# Patient Record
Sex: Male | Born: 1965 | Race: White | Hispanic: No | Marital: Single | Smoking: Current every day smoker
Health system: Southern US, Community
[De-identification: ages and names within clinical notes are randomized; demographics above are authoritative.]

## PROBLEM LIST (undated history)

## (undated) DIAGNOSIS — F32A Depression, unspecified: Secondary | ICD-10-CM

## (undated) DIAGNOSIS — I10 Essential (primary) hypertension: Secondary | ICD-10-CM

## (undated) DIAGNOSIS — F329 Major depressive disorder, single episode, unspecified: Secondary | ICD-10-CM

## (undated) HISTORY — DX: Major depressive disorder, single episode, unspecified: F32.9

## (undated) HISTORY — DX: Depression, unspecified: F32.A

---

## 2004-05-23 ENCOUNTER — Emergency Department (HOSPITAL_COMMUNITY): Admission: EM | Admit: 2004-05-23 | Discharge: 2004-05-23 | Payer: Self-pay | Admitting: Family Medicine

## 2005-09-18 ENCOUNTER — Emergency Department (HOSPITAL_COMMUNITY): Admission: EM | Admit: 2005-09-18 | Discharge: 2005-09-18 | Payer: Self-pay | Admitting: Emergency Medicine

## 2011-08-26 ENCOUNTER — Encounter: Payer: Self-pay | Admitting: Family Medicine

## 2011-08-26 ENCOUNTER — Ambulatory Visit (INDEPENDENT_AMBULATORY_CARE_PROVIDER_SITE_OTHER): Payer: Self-pay | Admitting: Family Medicine

## 2011-08-26 VITALS — BP 140/84 | HR 85 | Ht 71.0 in | Wt 180.9 lb

## 2011-08-26 DIAGNOSIS — F17200 Nicotine dependence, unspecified, uncomplicated: Secondary | ICD-10-CM

## 2011-08-26 DIAGNOSIS — Z720 Tobacco use: Secondary | ICD-10-CM

## 2011-09-09 ENCOUNTER — Encounter: Payer: Self-pay | Admitting: Family Medicine

## 2011-09-09 DIAGNOSIS — Z720 Tobacco use: Secondary | ICD-10-CM | POA: Insufficient documentation

## 2011-09-09 NOTE — Assessment & Plan Note (Signed)
Counseled on tobacco cessation.  I think he needs PFT's once he gets established with [facility].  Will base treatment options depending on PFT's.

## 2011-09-09 NOTE — Progress Notes (Signed)
  Subjective:    Patient ID: Evan Bennett, male    DOB: 17-Mar-1966, 45 y.o.   MRN: [id number]  HPI  Here to establish care so that he could apply for [facility] card.  Denies any current problems.  Has a hx of tobacco use, currenlty smokes 1ppd for the past 25 years.  Does get occasional symptoms of SOB and thinks he may have a history of COPD, does not recall being on any medication.  Review of Systems     Objective:   Physical Exam  Constitutional: He is oriented to person, place, and time. He appears well-developed and well-nourished. No distress.       Smells of cigarette smoke  HENT:  Head: Normocephalic and atraumatic.  Cardiovascular: Normal rate and regular rhythm.  Exam reveals no gallop and no friction rub.   No murmur heard. Pulmonary/Chest: Effort normal and breath sounds normal. No respiratory distress. He has no wheezes.  Abdominal: Soft. Bowel sounds are normal. He exhibits no distension. There is no tenderness.  Musculoskeletal: He exhibits no edema.  Neurological: He is alert and oriented to person, place, and time.  Psychiatric: He has a normal mood and affect.          Assessment & Plan:

## 2018-05-29 ENCOUNTER — Other Ambulatory Visit: Payer: Self-pay | Admitting: Orthopedic Surgery

## 2018-05-29 DIAGNOSIS — M533 Sacrococcygeal disorders, not elsewhere classified: Secondary | ICD-10-CM

## 2018-06-16 ENCOUNTER — Ambulatory Visit
Admission: RE | Admit: 2018-06-16 | Discharge: 2018-06-16 | Disposition: A | Discharge status: Home / Self Care | Payer: Worker's Compensation | Source: Ambulatory Visit | Attending: Orthopedic Surgery | Admitting: Orthopedic Surgery

## 2018-06-16 ENCOUNTER — Ambulatory Visit
Admission: RE | Admit: 2018-06-16 | Discharge: 2018-06-16 | Disposition: A | Discharge status: Home / Self Care | Payer: Self-pay | Source: Ambulatory Visit | Attending: Orthopedic Surgery | Admitting: Orthopedic Surgery

## 2018-06-16 DIAGNOSIS — M533 Sacrococcygeal disorders, not elsewhere classified: Secondary | ICD-10-CM

## 2018-06-30 ENCOUNTER — Other Ambulatory Visit: Payer: Self-pay | Admitting: Orthopedic Surgery

## 2018-06-30 DIAGNOSIS — M533 Sacrococcygeal disorders, not elsewhere classified: Secondary | ICD-10-CM

## 2018-07-14 ENCOUNTER — Ambulatory Visit
Admission: RE | Admit: 2018-07-14 | Discharge: 2018-07-14 | Disposition: A | Discharge status: Home / Self Care | Payer: Worker's Compensation | Source: Ambulatory Visit | Attending: Orthopedic Surgery | Admitting: Orthopedic Surgery

## 2018-07-14 DIAGNOSIS — M533 Sacrococcygeal disorders, not elsewhere classified: Secondary | ICD-10-CM

## 2018-07-14 MED ORDER — METHYLPREDNISOLONE ACETATE 40 MG/ML INJ SUSP (RADIOLOG
120.0000 mg | Freq: Once | INTRAMUSCULAR | Status: AC
  Administered 2018-07-14: 120 mg via INTRA_ARTICULAR

## 2019-10-07 IMAGING — CT CT BIOPSY
2 of 4 series · 14 of 32 positions shown, 19 images · non-contrast
Comparison: none

CLINICAL DATA: LEFT sacroiliac joint dysfunction. Worker's
compensation injury.

[Series 2: needle -guided injection · axial · 0.66mm/px · z∈[-174,-44]mm · 8 of 85 slices shown, 13 images (1 of 2)]
[im 10/85  soft-tissue]
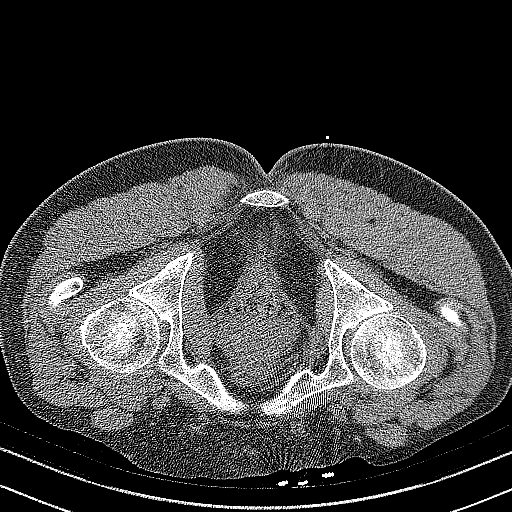
[im 10/85  bone]
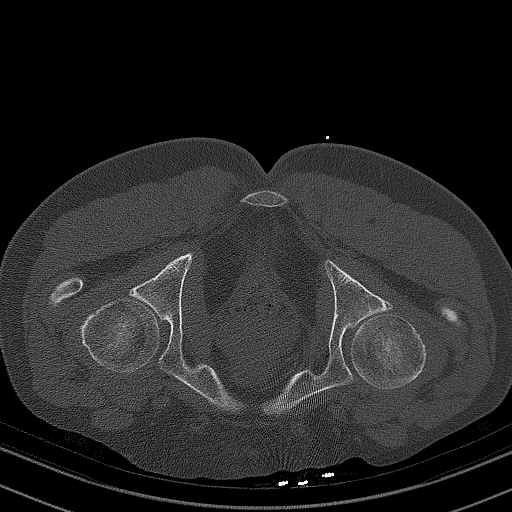
[im 19/85  soft-tissue]
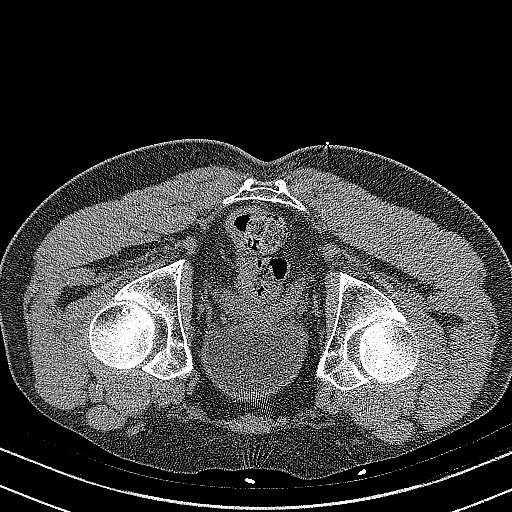
[im 29/85  soft-tissue]
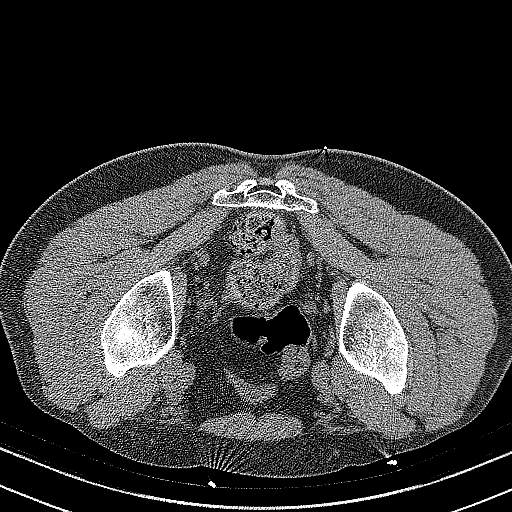
[im 38/85  soft-tissue]
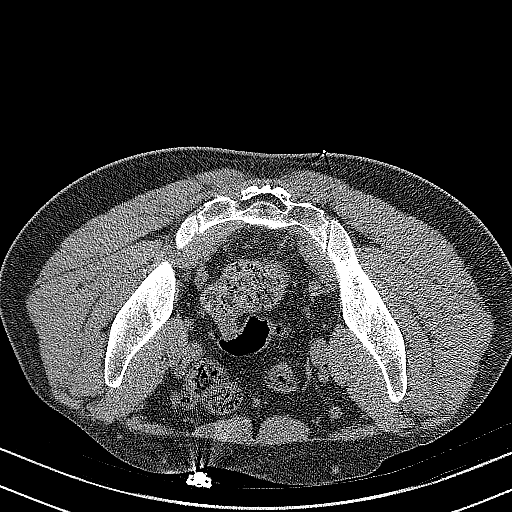
[im 47/85  soft-tissue]
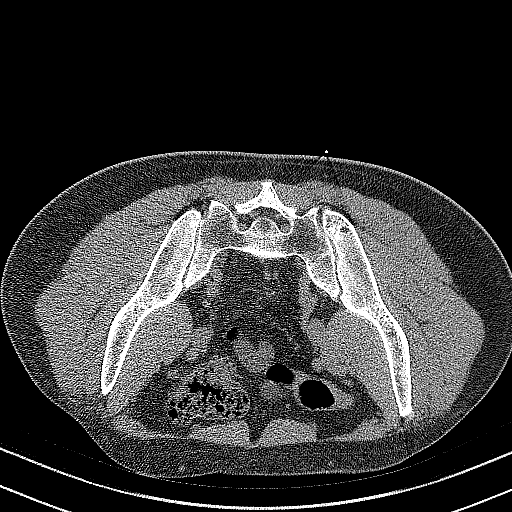
[im 47/85  lung]
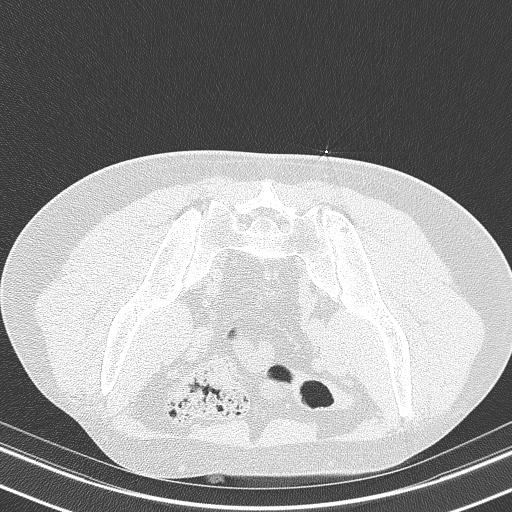
[im 57/85  soft-tissue]
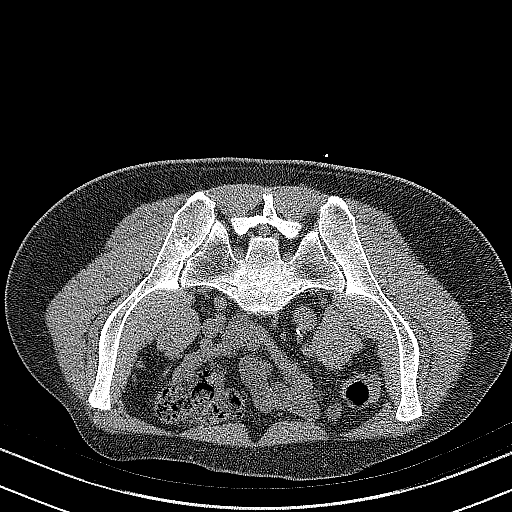
[im 57/85  lung]
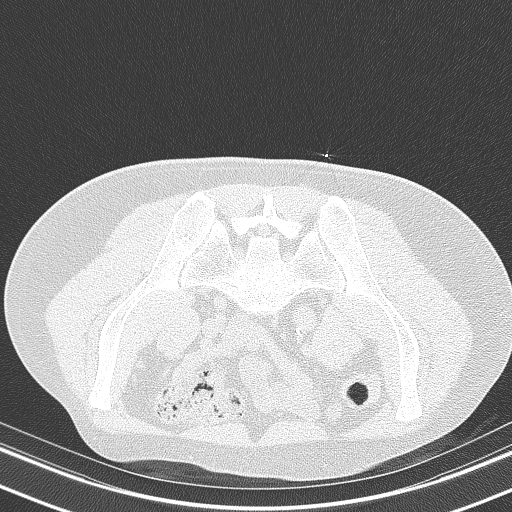
[im 66/85  soft-tissue]
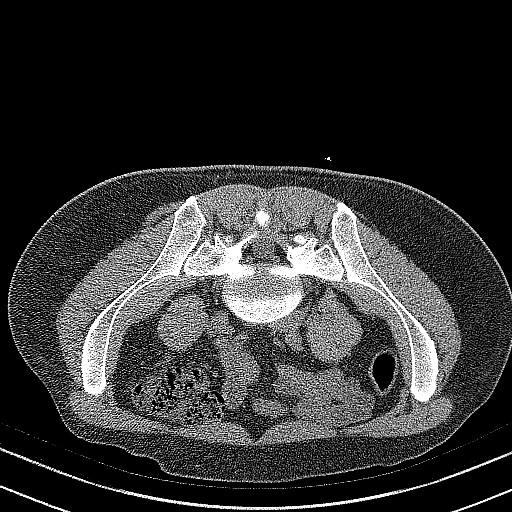
[im 66/85  lung]
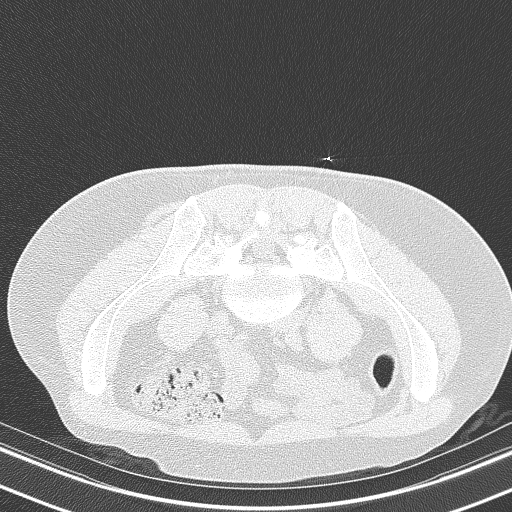
[im 75/85  soft-tissue]
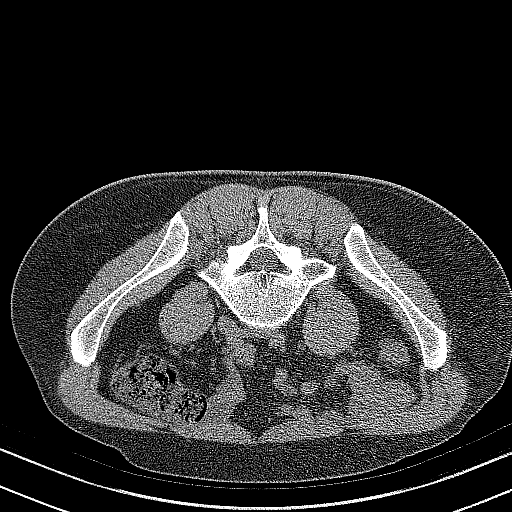
[im 75/85  lung]
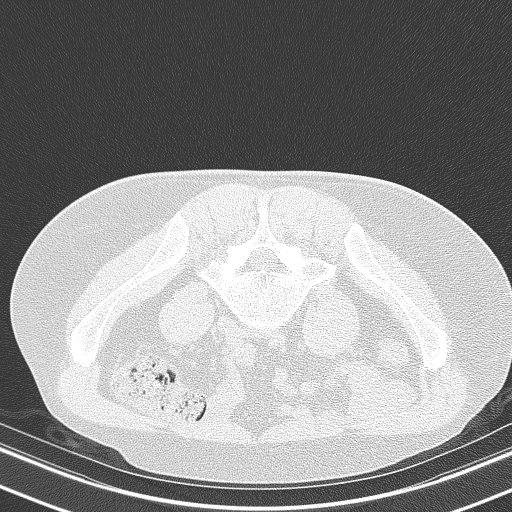

[Series 3: needle -guided injection · axial · 0.66mm/px · z∈[-174,-80]mm · 6 of 85 slices shown (2 of 2)]
[im 10/85  soft-tissue]
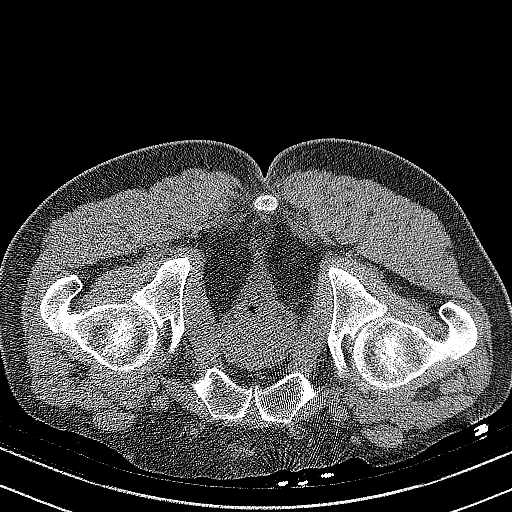
[im 19/85  soft-tissue]
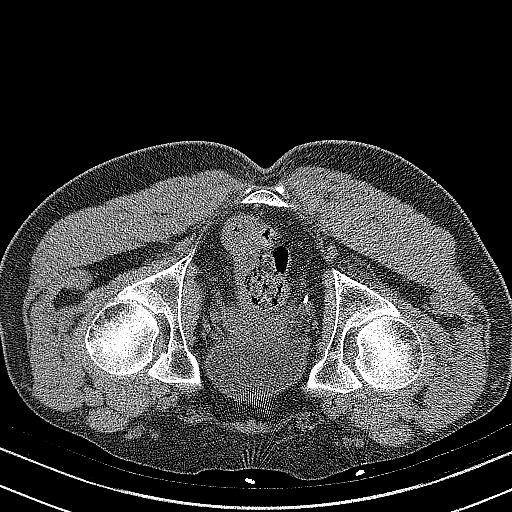
[im 29/85  soft-tissue]
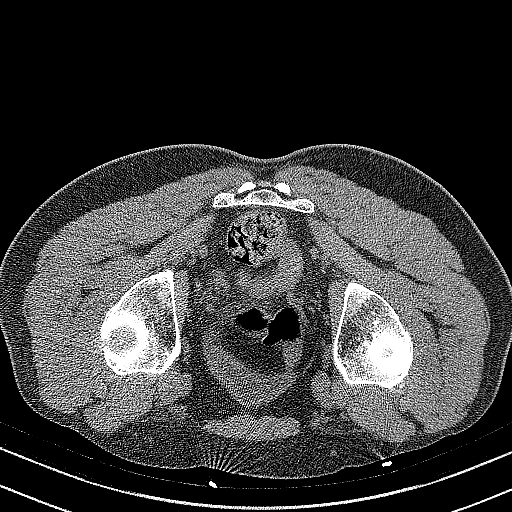
[im 38/85  soft-tissue]
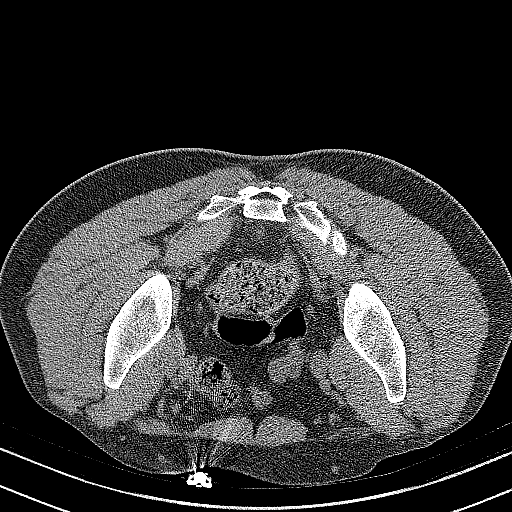
[im 47/85  soft-tissue]
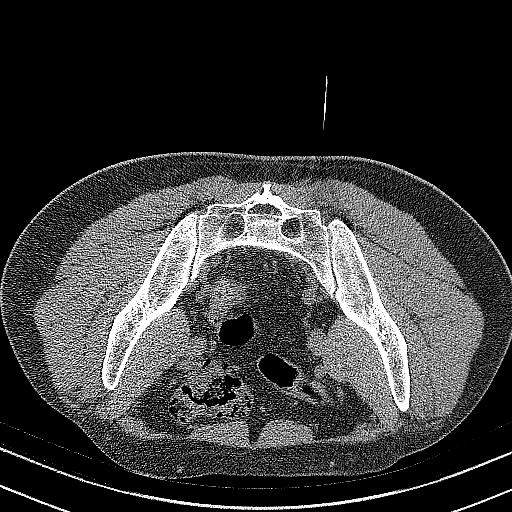
[im 57/85  soft-tissue]
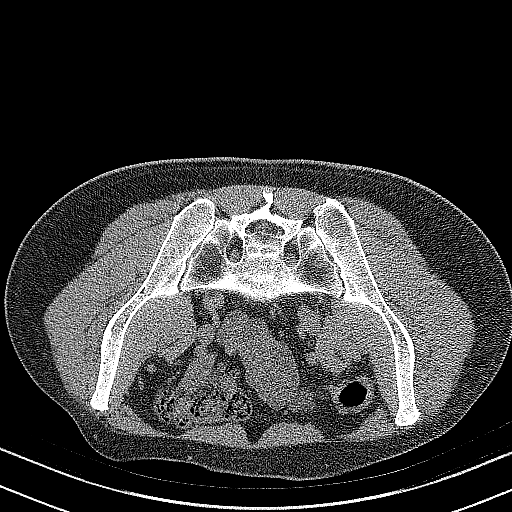

[14 of 32 positions shown; findings below may reference images not displayed]

EXAM:
LEFT CT GUIDED SI JOINT INJECTION



After local anesthesia with 1% lidocaine without epinephrine and
subsequent deep anesthesia, a 22 gauge spinal needle was advanced
into the LEFT SI joint under intermittent CT guidance.

Once the needle was in satisfactory position, representative image
was captured with the needle demonstrated in the sacroiliac joint
following contrast injection using a small amount of Isovue-M 200.
Subsequently, 120 mg Depo-Medrol was injected into the LEFT SI joint
and followed by 1 mL of 0.5% Sensorcaine. Needles removed and a
sterile dressing applied.

No complications were observed. Postprocedure, the patient endorsed
improvement.
IMPRESSION: Successful CT-guided LEFT SI joint injection with steroid.

## 2021-04-30 ENCOUNTER — Emergency Department (HOSPITAL_COMMUNITY)
Admission: EM | Admit: 2021-04-30 | Discharge: 2021-04-30 | Disposition: A | Discharge status: Home / Self Care | Payer: Self-pay | Attending: Emergency Medicine | Admitting: Emergency Medicine

## 2021-04-30 ENCOUNTER — Other Ambulatory Visit: Payer: Self-pay

## 2021-04-30 ENCOUNTER — Encounter (HOSPITAL_COMMUNITY): Payer: Self-pay | Admitting: Emergency Medicine

## 2021-04-30 DIAGNOSIS — F1193 Opioid use, unspecified with withdrawal: Secondary | ICD-10-CM

## 2021-04-30 DIAGNOSIS — F17210 Nicotine dependence, cigarettes, uncomplicated: Secondary | ICD-10-CM | POA: Insufficient documentation

## 2021-04-30 DIAGNOSIS — Z79899 Other long term (current) drug therapy: Secondary | ICD-10-CM | POA: Insufficient documentation

## 2021-04-30 DIAGNOSIS — Y900 Blood alcohol level of less than 20 mg/100 ml: Secondary | ICD-10-CM | POA: Insufficient documentation

## 2021-04-30 DIAGNOSIS — F1123 Opioid dependence with withdrawal: Secondary | ICD-10-CM | POA: Insufficient documentation

## 2021-04-30 LAB — COMPREHENSIVE METABOLIC PANEL
ALT: 18 U/L (ref 0–44)
AST: 19 U/L (ref 15–41)
Albumin: 3.9 g/dL (ref 3.5–5.0)
Alkaline Phosphatase: 89 U/L (ref 38–126)
Anion gap: 8 (ref 5–15)
BUN: 11 mg/dL (ref 6–20)
CO2: 26 mmol/L (ref 22–32)
Calcium: 9.4 mg/dL (ref 8.9–10.3)
Chloride: 107 mmol/L (ref 98–111)
Creatinine, Ser: 0.94 mg/dL (ref 0.61–1.24)
GFR, Estimated: >60 mL/min (ref >60)
Glucose, Bld: 97 mg/dL (ref 70–99)
Potassium: 3.7 mmol/L (ref 3.5–5.1)
Sodium: 141 mmol/L (ref 135–145)
Total Bilirubin: 0.7 mg/dL (ref 0.3–1.2)
Total Protein: 7.1 g/dL (ref 6.5–8.1)

## 2021-04-30 LAB — CBC
HCT: 43.7 % (ref 39.0–52.0)
Hemoglobin: 14.7 g/dL (ref 13.0–17.0)
MCH: 30.1 pg (ref 26.0–34.0)
MCHC: 33.6 g/dL (ref 30.0–36.0)
MCV: 89.5 fL (ref 80.0–100.0)
Platelets: 215 10*3/uL (ref 150–400)
RBC: 4.88 MIL/uL (ref 4.22–5.81)
RDW: 13.1 % (ref 11.5–15.5)
WBC: 11.9 10*3/uL — ABNORMAL HIGH (ref 4.0–10.5)
nRBC: 0 % (ref 0.0–0.2)

## 2021-04-30 LAB — ACETAMINOPHEN LEVEL: Acetaminophen (Tylenol), Serum: <10 ug/mL — ABNORMAL LOW (ref 10–30)

## 2021-04-30 LAB — ETHANOL: Alcohol, Ethyl (B): <10 mg/dL (ref <10)

## 2021-04-30 LAB — SALICYLATE LEVEL: Salicylate Lvl: <7 mg/dL — ABNORMAL LOW (ref 7.0–30.0)

## 2021-04-30 MED ORDER — NAPROXEN 500 MG PO TABS
500.0000 mg | ORAL_TABLET | Freq: Two times a day (BID) | ORAL | Status: DC | PRN
  Administered 2021-04-30: 500 mg via ORAL
  Filled 2021-04-30: qty 1

## 2021-04-30 MED ORDER — CLONIDINE HCL 0.1 MG PO TABS
0.1000 mg | ORAL_TABLET | Freq: Four times a day (QID) | ORAL | Status: DC
  Filled 2021-04-30: qty 1

## 2021-04-30 MED ORDER — HYDROXYZINE HCL 25 MG PO TABS
25.0000 mg | ORAL_TABLET | Freq: Four times a day (QID) | ORAL | Status: DC | PRN
  Administered 2021-04-30: 25 mg via ORAL
  Filled 2021-04-30: qty 1

## 2021-04-30 MED ORDER — ONDANSETRON HCL 4 MG PO TABS: 4.0000 mg | ORAL_TABLET | Freq: Three times a day (TID) | ORAL | 0 refills | Status: DC | PRN

## 2021-04-30 MED ORDER — NAPROXEN 500 MG PO TABS: 500.0000 mg | ORAL_TABLET | Freq: Two times a day (BID) | ORAL | 0 refills | Status: DC

## 2021-04-30 MED ORDER — ONDANSETRON 4 MG PO TBDP
4.0000 mg | ORAL_TABLET | Freq: Four times a day (QID) | ORAL | Status: DC | PRN
  Administered 2021-04-30: 4 mg via ORAL
  Filled 2021-04-30: qty 1

## 2021-04-30 MED ORDER — CLONIDINE HCL 0.1 MG PO TABS: 0.1000 mg | ORAL_TABLET | Freq: Two times a day (BID) | ORAL | Status: DC

## 2021-04-30 MED ORDER — METHOCARBAMOL 500 MG PO TABS
500.0000 mg | ORAL_TABLET | Freq: Three times a day (TID) | ORAL | Status: DC | PRN
  Administered 2021-04-30: 500 mg via ORAL
  Filled 2021-04-30: qty 1

## 2021-04-30 MED ORDER — HYDROXYZINE HCL 25 MG PO TABS: 25.0000 mg | ORAL_TABLET | Freq: Four times a day (QID) | ORAL | 0 refills | Status: DC

## 2021-04-30 MED ORDER — CLONIDINE HCL 0.1 MG PO TABS: ORAL_TABLET | ORAL | 1 refills | Status: DC

## 2021-04-30 MED ORDER — METHOCARBAMOL 500 MG PO TABS: 500.0000 mg | ORAL_TABLET | Freq: Two times a day (BID) | ORAL | 0 refills | Status: DC

## 2021-04-30 MED ORDER — LOPERAMIDE HCL 2 MG PO CAPS
2.0000 mg | ORAL_CAPSULE | ORAL | Status: DC | PRN
  Administered 2021-04-30: 2 mg via ORAL
  Filled 2021-04-30: qty 1

## 2021-04-30 MED ORDER — DICYCLOMINE HCL 20 MG PO TABS
20.0000 mg | ORAL_TABLET | Freq: Four times a day (QID) | ORAL | Status: DC | PRN
  Administered 2021-04-30: 20 mg via ORAL
  Filled 2021-04-30: qty 1

## 2021-04-30 MED ORDER — CLONIDINE HCL 0.1 MG PO TABS
0.1000 mg | ORAL_TABLET | Freq: Every day | ORAL | Status: DC
  Administered 2021-04-30: 0.1 mg via ORAL

## 2021-04-30 MED ORDER — LOPERAMIDE HCL 2 MG PO CAPS: 2.0000 mg | ORAL_CAPSULE | Freq: Four times a day (QID) | ORAL | 0 refills | Status: DC | PRN

## 2021-04-30 MED ORDER — DICYCLOMINE HCL 20 MG PO TABS: 20.0000 mg | ORAL_TABLET | Freq: Two times a day (BID) | ORAL | 0 refills | Status: DC

## 2021-04-30 MED ORDER — LORAZEPAM 2 MG/ML IJ SOLN
2.0000 mg | Freq: Once | INTRAMUSCULAR | Status: AC
  Administered 2021-04-30: 2 mg via INTRAMUSCULAR
  Filled 2021-04-30: qty 1

## 2021-04-30 NOTE — Discharge Instructions (Addendum)
1. Medications: Clonidine withdrawal protocol; usual home medications 2. Treatment: rest, drink plenty of fluids,  3. Follow Up: Please followup with your primary doctor in 2-3 days for discussion of your diagnoses and further evaluation after today's visit; if you do not have a primary care doctor use the resource guide provided to find one; Please return to the ER for new or worsening symptoms

## 2021-04-30 NOTE — ED Notes (Signed)
Walked into room, patient has not provided a urine sample. Patient was standing at the edge of the bed. Stated "it's haunting me".  Stated the medicine is not working. I advised I would let the provider know.

## 2021-04-30 NOTE — ED Notes (Signed)
Clonidine given now at providers request. Patient is hollering and moaning, shaking the rails of the bed.

## 2021-04-30 NOTE — ED Notes (Signed)
Walked into room, patient was being seen by the provider. Prior to that, patient had refused to give urine sample. However after talking to patient he stated he would agree to the urine sample and stated he needed help for withdrawal. Patient stated he last inhaled fentanyl at 9am.

## 2021-04-30 NOTE — ED Triage Notes (Signed)
Patient recently started taking an opioid that was not prescribed. Patient comes from home. Today is the first day he has not taken any opioids today.

## 2021-04-30 NOTE — ED Notes (Addendum)
Walked into room, patient was standing at the cabinets. Patient had opened the cabinet doors and was hitting his head against the cabinet shelves. I advised the patient to get back in to the bed for his safety and assisted him. Patient began screaming that the medications were not working. I reminded the patient that he needed to give the medications time. PA is aware.

## 2021-04-30 NOTE — ED Notes (Signed)
Pt ambulated out of ED. Verbalized understanding of his d/c papers

## 2021-04-30 NOTE — ED Notes (Signed)
Patient was found walking around the ER. Patient did not want to be redirected into his room. I advised the patient that he could not walk into other patients room. Patient attempted to jump on the table next to [facility] and started the patient in the hallway. Patient was finally redirected to his room.

## 2021-04-30 NOTE — ED Provider Notes (Signed)
[facility]-EMERGENCY DEPT Provider Note   CSN: [id number] Arrival date & time: 04/30/21  0129     History Chief Complaint  Patient presents with   Anxiety    Evan Bennett is a 55 y.o. male presents to the emergency department complaining of opiate withdrawal.  Reports he has been snorting fentanyl for the last several years.  Denies IV drug use.  Reports that he is in withdrawal tonight because he has run out of money to buy fentanyl.  Denies suicidal ideation, homicidal ideation, auditory visual hallucinations.  Reports he simply feels anxious and wants help.  He states that opiates have ruined his life and he wants to clean but he does not know how.  Denies headache, vision changes, chest pain, shortness of breath, abdominal pain, nausea, vomiting, diarrhea, weakness, dizziness, syncope, palpitations.  No treatments prior to arrival.  No specific aggravating or alleviating factors aside from lack of opiates.  The history is provided by the patient and medical records. No language interpreter was used.      Past Medical History:  Diagnosis Date   Depression     Patient Active Problem List   Diagnosis Date Noted   Tobacco abuse 09/09/2011    History reviewed. No pertinent surgical history.     Family History  Problem Relation Age of Onset   Alzheimer's disease Mother    Lung cancer Father    Hypertension Mother    Hypertension Brother     Social History   Tobacco Use   Smoking status: Every Day    Packs/day: 1.00    Types: Cigarettes  Vaping Use   Vaping Use: Never used  Substance Use Topics   Alcohol use: Not Currently   Drug use: Yes    Home Medications Prior to Admission medications   Medication Sig Start Date End Date Taking? Authorizing Provider  cloNIDine (CATAPRES) 0.1 MG tablet 0.1mg  Oral 4x daily x8 doses; 0.1mg  oral BH q-am and hs x 4 doses; 0.1mg  oral daily before breakfast x 2 doses 04/30/21  Yes Jerita Wimbush, Dahlia Client, PA-C   dicyclomine (BENTYL) 20 MG tablet Take 1 tablet (20 mg total) by mouth 2 (two) times daily. 04/30/21  Yes Cindel Daugherty, Dahlia Client, PA-C  hydrOXYzine (ATARAX/VISTARIL) 25 MG tablet Take 1 tablet (25 mg total) by mouth every 6 (six) hours. 04/30/21  Yes Alarik Radu, Dahlia Client, PA-C  loperamide (IMODIUM) 2 MG capsule Take 1 capsule (2 mg total) by mouth 4 (four) times daily as needed for diarrhea or loose stools. 04/30/21  Yes Luian Schumpert, Dahlia Client, PA-C  methocarbamol (ROBAXIN) 500 MG tablet Take 1 tablet (500 mg total) by mouth 2 (two) times daily. 04/30/21  Yes Abram Sax, Dahlia Client, PA-C  naproxen (NAPROSYN) 500 MG tablet Take 1 tablet (500 mg total) by mouth 2 (two) times daily with a meal. 04/30/21  Yes Dash Cardarelli, Dahlia Client, PA-C  ondansetron (ZOFRAN) 4 MG tablet Take 1 tablet (4 mg total) by mouth every 8 (eight) hours as needed for nausea or vomiting. 04/30/21  Yes Pankaj Haack, Dahlia Client, PA-C    Allergies    Patient has no known allergies.  Review of Systems   Review of Systems  Constitutional:  Negative for appetite change, diaphoresis, fatigue, fever and unexpected weight change.  HENT:  Negative for mouth sores.   Eyes:  Negative for visual disturbance.  Respiratory:  Negative for cough, chest tightness, shortness of breath and wheezing.   Cardiovascular:  Negative for chest pain.  Gastrointestinal:  Negative for abdominal pain, constipation, diarrhea, nausea and  vomiting.  Endocrine: Negative for polydipsia, polyphagia and polyuria.  Genitourinary:  Negative for dysuria, frequency, hematuria and urgency.  Musculoskeletal:  Negative for back pain and neck stiffness.  Skin:  Negative for rash.  Allergic/Immunologic: Negative for immunocompromised state.  Neurological:  Negative for syncope, light-headedness and headaches.  Hematological:  Does not bruise/bleed easily.  Psychiatric/Behavioral:  Positive for agitation. Negative for sleep disturbance. The patient is nervous/anxious and is  hyperactive.    Physical Exam Updated Vital Signs BP (!) 145/110   Pulse 86   Temp 98.4 F (36.9 C)   Resp 16   Ht 5\' 8"  (1.727 m)   Wt 72.6 kg   SpO2 99%   BMI 24.33 kg/m   Physical Exam Vitals and nursing note reviewed.  Constitutional:      General: He is not in acute distress.    Appearance: He is not diaphoretic.  HENT:     Head: Normocephalic.  Eyes:     General: No scleral icterus.    Conjunctiva/sclera: Conjunctivae normal.  Cardiovascular:     Rate and Rhythm: Normal rate and regular rhythm.     Pulses: Normal pulses.          Radial pulses are 2+ on the right side and 2+ on the left side.  Pulmonary:     Effort: No tachypnea, accessory muscle usage, prolonged expiration, respiratory distress or retractions.     Breath sounds: No stridor.     Comments: Equal chest rise. No increased work of breathing. Abdominal:     General: There is no distension.     Palpations: Abdomen is soft.     Tenderness: There is no abdominal tenderness. There is no guarding or rebound.  Musculoskeletal:     Cervical back: Normal range of motion.     Comments: Moves all extremities equally and without difficulty.  Skin:    General: Skin is warm and dry.     Capillary Refill: Capillary refill takes less than 2 seconds.  Neurological:     Mental Status: He is alert.     GCS: GCS eye subscore is 4. GCS verbal subscore is 5. GCS motor subscore is 6.     Comments: Speech is clear and goal oriented.  Psychiatric:        Attention and Perception: He does not perceive auditory or visual hallucinations.        Mood and Affect: Mood normal.        Behavior: Behavior is agitated and aggressive.        Thought Content: Thought content does not include homicidal or suicidal ideation. Thought content does not include homicidal or suicidal plan.        Judgment: Judgment is impulsive.    ED Results / Procedures / Treatments   Labs (all labs ordered are listed, but only abnormal results are  displayed) Labs Reviewed  SALICYLATE LEVEL - Abnormal; Notable for the following components:      Result Value   Salicylate Lvl <7.0 (*)    All other components within normal limits  ACETAMINOPHEN LEVEL - Abnormal; Notable for the following components:   Acetaminophen (Tylenol), Serum <10 (*)    All other components within normal limits  CBC - Abnormal; Notable for the following components:   WBC 11.9 (*)    All other components within normal limits  COMPREHENSIVE METABOLIC PANEL  ETHANOL  RAPID URINE DRUG SCREEN, HOSP PERFORMED  URINALYSIS, ROUTINE W REFLEX MICROSCOPIC     Procedures Procedures  Medications Ordered in ED Medications  cloNIDine (CATAPRES) tablet 0.1 mg (has no administration in time range)    Followed by  cloNIDine (CATAPRES) tablet 0.1 mg (has no administration in time range)    Followed by  cloNIDine (CATAPRES) tablet 0.1 mg (0.1 mg Oral Given 04/30/21 0245)  dicyclomine (BENTYL) tablet 20 mg (20 mg Oral Given 04/30/21 0245)  hydrOXYzine (ATARAX/VISTARIL) tablet 25 mg (25 mg Oral Given 04/30/21 0245)  loperamide (IMODIUM) capsule 2-4 mg (2 mg Oral Given 04/30/21 0249)  methocarbamol (ROBAXIN) tablet 500 mg (500 mg Oral Given 04/30/21 0245)  naproxen (NAPROSYN) tablet 500 mg (500 mg Oral Given 04/30/21 0246)  ondansetron (ZOFRAN-ODT) disintegrating tablet 4 mg (4 mg Oral Given 04/30/21 0247)  LORazepam (ATIVAN) injection 2 mg (2 mg Intramuscular Given 04/30/21 0201)    ED Course  I have reviewed the triage vital signs and the nursing notes.  Pertinent labs & imaging results that were available during my care of the patient were reviewed by me and considered in my medical decision making (see chart for details).    MDM Rules/Calculators/A&P                           Patient agitated and uncooperative.  Difficult to redirect.  Remains alert and oriented.  Adamantly denies suicidal or homicidal ideation.  Vital signs are reassuring.  Clonidine protocol  initiated in the emergency department and patient symptoms seem to have improved.  Will be discharged home with symptomatic medications and resources for substance abuse.  Peer support referral placed.  Patient ambulatory with steady gait.  BP (!) 143/82 (BP Location: Left Arm)   Pulse 68   Temp 98.4 F (36.9 C)   Resp 16   Ht 5\' 8"  (1.727 m)   Wt 72.6 kg   SpO2 98%   BMI 24.33 kg/m    Final Clinical Impression(s) / ED Diagnoses Final diagnoses:  Opioid withdrawal (HCC)    Rx / DC Orders ED Discharge Orders          Ordered    cloNIDine (CATAPRES) 0.1 MG tablet        04/30/21 0351    dicyclomine (BENTYL) 20 MG tablet  2 times daily        04/30/21 0351    hydrOXYzine (ATARAX/VISTARIL) 25 MG tablet  Every 6 hours        04/30/21 0351    loperamide (IMODIUM) 2 MG capsule  4 times daily PRN        04/30/21 0351    methocarbamol (ROBAXIN) 500 MG tablet  2 times daily        04/30/21 0351    naproxen (NAPROSYN) 500 MG tablet  2 times daily with meals        04/30/21 0351    ondansetron (ZOFRAN) 4 MG tablet  Every 8 hours PRN        04/30/21 0351             Janah Mcculloh, 05/02/21, PA-C 04/30/21 0356    05/02/21, MD 04/30/21 [phone]

## 2023-01-17 ENCOUNTER — Ambulatory Visit (INDEPENDENT_AMBULATORY_CARE_PROVIDER_SITE_OTHER): Payer: No Typology Code available for payment source

## 2023-01-17 ENCOUNTER — Ambulatory Visit (HOSPITAL_COMMUNITY)
Admission: EM | Admit: 2023-01-17 | Discharge: 2023-01-17 | Disposition: A | Discharge status: Home / Self Care | Payer: No Typology Code available for payment source | Attending: Family Medicine | Admitting: Family Medicine

## 2023-01-17 ENCOUNTER — Encounter (HOSPITAL_COMMUNITY): Payer: Self-pay

## 2023-01-17 DIAGNOSIS — J441 Chronic obstructive pulmonary disease with (acute) exacerbation: Secondary | ICD-10-CM | POA: Insufficient documentation

## 2023-01-17 DIAGNOSIS — R911 Solitary pulmonary nodule: Secondary | ICD-10-CM | POA: Insufficient documentation

## 2023-01-17 DIAGNOSIS — J069 Acute upper respiratory infection, unspecified: Secondary | ICD-10-CM

## 2023-01-17 DIAGNOSIS — Z1152 Encounter for screening for COVID-19: Secondary | ICD-10-CM | POA: Insufficient documentation

## 2023-01-17 DIAGNOSIS — B9789 Other viral agents as the cause of diseases classified elsewhere: Secondary | ICD-10-CM | POA: Insufficient documentation

## 2023-01-17 HISTORY — DX: Essential (primary) hypertension: I10

## 2023-01-17 LAB — COMPREHENSIVE METABOLIC PANEL
ALT: 13 U/L (ref 0–44)
AST: 18 U/L (ref 15–41)
Albumin: 3.2 g/dL — ABNORMAL LOW (ref 3.5–5.0)
Alkaline Phosphatase: 104 U/L (ref 38–126)
Anion gap: 11 (ref 5–15)
BUN: 10 mg/dL (ref 6–20)
CO2: 27 mmol/L (ref 22–32)
Calcium: 8.6 mg/dL — ABNORMAL LOW (ref 8.9–10.3)
Chloride: 100 mmol/L (ref 98–111)
Creatinine, Ser: 0.91 mg/dL (ref 0.61–1.24)
GFR, Estimated: >60 mL/min (ref >60)
Glucose, Bld: 89 mg/dL (ref 70–99)
Potassium: 3.8 mmol/L (ref 3.5–5.1)
Sodium: 138 mmol/L (ref 135–145)
Total Bilirubin: 0.5 mg/dL (ref 0.3–1.2)
Total Protein: 6.2 g/dL — ABNORMAL LOW (ref 6.5–8.1)

## 2023-01-17 LAB — CBC
HCT: 41.6 % (ref 39.0–52.0)
Hemoglobin: 13.5 g/dL (ref 13.0–17.0)
MCH: 28.9 pg (ref 26.0–34.0)
MCHC: 32.5 g/dL (ref 30.0–36.0)
MCV: 89.1 fL (ref 80.0–100.0)
Platelets: 217 10*3/uL (ref 150–400)
RBC: 4.67 MIL/uL (ref 4.22–5.81)
RDW: 13 % (ref 11.5–15.5)
WBC: 8.4 10*3/uL (ref 4.0–10.5)
nRBC: 0 % (ref 0.0–0.2)

## 2023-01-17 MED ORDER — BENZONATATE 100 MG PO CAPS: 100.0000 mg | ORAL_CAPSULE | Freq: Three times a day (TID) | ORAL | 0 refills | Status: DC | PRN

## 2023-01-17 MED ORDER — ALBUTEROL SULFATE HFA 108 (90 BASE) MCG/ACT IN AERS: 2.0000 | INHALATION_SPRAY | RESPIRATORY_TRACT | 0 refills | Status: DC | PRN

## 2023-01-17 MED ORDER — PREDNISONE 20 MG PO TABS: 40.0000 mg | ORAL_TABLET | Freq: Every day | ORAL | 0 refills | Status: AC

## 2023-01-17 NOTE — ED Triage Notes (Signed)
Patient c/o a productive cough with yellow/white sputum,and nasal congestion x 2 days.  Patient reports that he has been taking OTC cough drops.   Patient added that he has had bilateral leg swelling x 4-5 months.

## 2023-01-17 NOTE — Discharge Instructions (Signed)
Your chest x-ray did not show any fluid or pneumonia, but there was a tiny nodule that we will need to follow-up CAT scan at a later date.  You can use the QR code/website at the back of the summary paperwork to schedule yourself a new patient appointment with primary care   You have been swabbed for COVID, and the test will result in the next 24 hours. Our staff will call you if positive. If the COVID test is positive, you should quarantine until you are fever free for 24 hours and you are starting to feel better, and then take added precautions for the next 5 days, such as physical distancing/wearing a mask and good hand hygiene/washing.  Albuterol inhaler--do 2 puffs every 4 hours as needed for shortness of breath or wheezing  Take prednisone 20 mg--2 daily for 5 days  Take benzonatate 100 mg, 1 tab every 8 hours as needed for cough.

## 2023-01-17 NOTE — ED Notes (Signed)
New pt appointment was made prior to leaving and pt verbalized understanding of it.

## 2023-01-17 NOTE — ED Provider Notes (Addendum)
[facility]    CSN: [id number] Arrival date & time: 01/17/23  1529      History   Chief Complaint Chief Complaint  Patient presents with   Cough   Nasal Congestion   Leg Swelling    HPI Evan Bennett is a 57 y.o. male.    Cough   here for 2 or 3 days of congestion and cough.  He is producing white and yellow mucus.  No fever noted outside the clinic, but he did have a 99.4 temperature here.  No nausea or vomiting or diarrhea.  He does not mention any shortness of breath.  He has never been noted to have asthma or bronchitis.  He also mentions he has had bilateral leg swelling for about 4 or 5 months.      Past Medical History:  Diagnosis Date   Depression    Hypertension     Patient Active Problem List   Diagnosis Date Noted   Tobacco abuse 09/09/2011    History reviewed. No pertinent surgical history.     Home Medications    Prior to Admission medications   Medication Sig Start Date End Date Taking? Authorizing Provider  albuterol (VENTOLIN HFA) 108 (90 Base) MCG/ACT inhaler Inhale 2 puffs into the lungs every 4 (four) hours as needed for wheezing or shortness of breath. 01/17/23  Yes Zenia Resides, MD  benzonatate (TESSALON) 100 MG capsule Take 1 capsule (100 mg total) by mouth 3 (three) times daily as needed for cough. 01/17/23  Yes Zenia Resides, MD  predniSONE (DELTASONE) 20 MG tablet Take 2 tablets (40 mg total) by mouth daily with breakfast for 5 days. 01/17/23 01/22/23 Yes Leonilda Cozby, Janace Aris, MD    Family History Family History  Problem Relation Age of Onset   Alzheimer's disease Mother    Hypertension Mother    Lung cancer Father    Hypertension Brother     Social History Social History   Tobacco Use   Smoking status: Every Day    Packs/day: 1    Types: Cigarettes  Vaping Use   Vaping Use: Never used  Substance Use Topics   Alcohol use: Not Currently   Drug use: Never     Allergies   Patient has no known  allergies.   Review of Systems Review of Systems  Respiratory:  Positive for cough.      Physical Exam Triage Vital Signs ED Triage Vitals  Enc Vitals Group     BP 01/17/23 1606 130/85     Pulse Rate 01/17/23 1606 81     Resp 01/17/23 1606 16     Temp 01/17/23 1606 99.4 F (37.4 C)     Temp Source 01/17/23 1606 Oral     SpO2 01/17/23 1606 94 %     Weight --      Height --      Head Circumference --      Peak Flow --      Pain Score 01/17/23 1608 0     Pain Loc --      Pain Edu? --      Excl. in GC? --    No data found.  Updated Vital Signs BP 130/85 (BP Location: Left Arm)   Pulse 81   Temp 99.4 F (37.4 C) (Oral)   Resp 16   SpO2 94%   Visual Acuity Right Eye Distance:   Left Eye Distance:   Bilateral Distance:    Right Eye Near:  Left Eye Near:    Bilateral Near:     Physical Exam Vitals reviewed.  Constitutional:      General: He is not in acute distress.    Appearance: He is not ill-appearing, toxic-appearing or diaphoretic.  HENT:     Right Ear: Tympanic membrane and ear canal normal.     Left Ear: Tympanic membrane and ear canal normal.     Nose: Congestion present.     Mouth/Throat:     Mouth: Mucous membranes are moist.     Pharynx: No oropharyngeal exudate or posterior oropharyngeal erythema.  Eyes:     Extraocular Movements: Extraocular movements intact.     Conjunctiva/sclera: Conjunctivae normal.     Pupils: Pupils are equal, round, and reactive to light.  Cardiovascular:     Rate and Rhythm: Normal rate and regular rhythm.     Heart sounds: No murmur heard. Pulmonary:     Effort: No respiratory distress.     Breath sounds: No stridor. No rhonchi or rales.     Comments: There are bilateral wheezes heard, especially after he coughs.  Air movement is good.  There are no rales Musculoskeletal:     Cervical back: Neck supple.     Comments: There is 2+ pitting edema to the upper third of the shins bilaterally.  There is no erythema and  no ulceration  Lymphadenopathy:     Cervical: No cervical adenopathy.  Skin:    Capillary Refill: Capillary refill takes less than 2 seconds.     Coloration: Skin is not jaundiced or pale.  Neurological:     General: No focal deficit present.     Mental Status: He is alert and oriented to person, place, and time.  Psychiatric:        Behavior: Behavior normal.      UC Treatments / Results  Labs (all labs ordered are listed, but only abnormal results are displayed) Labs Reviewed  SARS CORONAVIRUS 2 (TAT 6-24 HRS)  CBC  COMPREHENSIVE METABOLIC PANEL    EKG   Radiology DG Chest 2 View  Result Date: 01/17/2023 CLINICAL DATA:  Cough and shortness of breath EXAM: CHEST - 2 VIEW COMPARISON:  Chest x-ray 09/18/2005 FINDINGS: The heart size and mediastinal contours are within normal limits. On the lateral view nodular density projects over the anterior upper lobes measuring 6 mm, indeterminate. The lungs are otherwise clear. The visualized skeletal structures are unremarkable. IMPRESSION: 1. No active cardiopulmonary disease. 2. 6 mm nodular density projecting over the anterior upper lobes on the lateral view, indeterminate. Follow-up nonemergent chest CT recommended. Electronically Signed   By: Darliss Cheney M.D.   On: 01/17/2023 17:08    Procedures Procedures (including critical care time)  Medications Ordered in UC Medications - No data to display  Initial Impression / Assessment and Plan / UC Course  I have reviewed the triage vital signs and the nursing notes.  Pertinent labs & imaging results that were available during my care of the patient were reviewed by me and considered in my medical decision making (see chart for details).        Chest x-ray is done as much for the history of leg swelling and shortness of breath.  Does not show any fluid nor pneumonia there is a small nodule that we will need follow-up CT.  Albuterol and prednisone are sent in for possible COPD  exacerbation, and Tessalon Perles are sent in for cough.  COVID swab is done  I will  ask staff to help him make a primary care appointment  CBC and CMP are drawn today and we will let him know of any significant abnormalities; this is to address his pedal edema. Final Clinical Impressions(s) / UC Diagnoses   Final diagnoses:  Viral URI with cough  COPD exacerbation (HCC)  Pulmonary nodule     Discharge Instructions      Your chest x-ray did not show any fluid or pneumonia, but there was a tiny nodule that we will need to follow-up CAT scan at a later date.  You can use the QR code/website at the back of the summary paperwork to schedule yourself a new patient appointment with primary care   You have been swabbed for COVID, and the test will result in the next 24 hours. Our staff will call you if positive. If the COVID test is positive, you should quarantine until you are fever free for 24 hours and you are starting to feel better, and then take added precautions for the next 5 days, such as physical distancing/wearing a mask and good hand hygiene/washing.  Albuterol inhaler--do 2 puffs every 4 hours as needed for shortness of breath or wheezing  Take prednisone 20 mg--2 daily for 5 days  Take benzonatate 100 mg, 1 tab every 8 hours as needed for cough.       ED Prescriptions     Medication Sig Dispense Auth. Provider   albuterol (VENTOLIN HFA) 108 (90 Base) MCG/ACT inhaler Inhale 2 puffs into the lungs every 4 (four) hours as needed for wheezing or shortness of breath. 1 each Zenia Resides, MD   predniSONE (DELTASONE) 20 MG tablet Take 2 tablets (40 mg total) by mouth daily with breakfast for 5 days. 10 tablet Zenia Resides, MD   benzonatate (TESSALON) 100 MG capsule Take 1 capsule (100 mg total) by mouth 3 (three) times daily as needed for cough. 21 capsule Zenia Resides, MD      PDMP not reviewed this encounter.   Zenia Resides, MD 01/17/23  1743    Zenia Resides, MD 01/17/23 2027

## 2023-01-18 LAB — SARS CORONAVIRUS 2 (TAT 6-24 HRS): SARS Coronavirus 2: NEGATIVE

## 2023-02-05 ENCOUNTER — Ambulatory Visit: Payer: No Typology Code available for payment source | Admitting: Family Medicine

## 2023-05-01 ENCOUNTER — Other Ambulatory Visit: Payer: Self-pay

## 2023-05-01 ENCOUNTER — Encounter (HOSPITAL_COMMUNITY): Payer: Self-pay

## 2023-05-01 ENCOUNTER — Emergency Department (HOSPITAL_COMMUNITY)
Admission: EM | Admit: 2023-05-01 | Discharge: 2023-05-01 | Disposition: A | Discharge status: Home / Self Care | Payer: No Typology Code available for payment source | Attending: Emergency Medicine | Admitting: Emergency Medicine

## 2023-05-01 DIAGNOSIS — K0889 Other specified disorders of teeth and supporting structures: Secondary | ICD-10-CM

## 2023-05-01 DIAGNOSIS — K029 Dental caries, unspecified: Secondary | ICD-10-CM | POA: Insufficient documentation

## 2023-05-01 MED ORDER — PENICILLIN V POTASSIUM 500 MG PO TABS: 500.0000 mg | ORAL_TABLET | Freq: Four times a day (QID) | ORAL | 0 refills | Status: AC

## 2023-05-01 MED ORDER — DICLOFENAC SODIUM 25 MG PO TBEC: 25.0000 mg | DELAYED_RELEASE_TABLET | Freq: Two times a day (BID) | ORAL | 0 refills | Status: AC

## 2023-05-01 NOTE — ED Triage Notes (Signed)
Right lower molar dental pain.   Recently obtained insurance and trying to get established with a dentist. Pain became intolerable even after taking ibuprofen.   Seeking temporary pain control.

## 2023-05-01 NOTE — Discharge Instructions (Addendum)
Take Penicillin as prescribed and complete the full course. Take Diclofenac if pain is not controlled with 650mg  Tylenol and 600mg  Advil Liquid Gel every 6 hours. Rinse with Listerine after every meal.  Follow up with a dentist, call to schedule an appointment.

## 2023-05-01 NOTE — ED Provider Notes (Signed)
[place] EMERGENCY DEPARTMENT AT [facility] Provider Note   CSN: [id number] Arrival date & time: 05/01/23  2243     History  Chief Complaint  Patient presents with   Dental Pain    Evan Bennett is a 57 y.o. male.  57 yo male with right lower dental pain, states poor dental health. No trauma, no fever, no drainage.        Home Medications Prior to Admission medications   Medication Sig Start Date End Date Taking? Authorizing Provider  diclofenac (VOLTAREN) 25 MG EC tablet Take 1 tablet (25 mg total) by mouth 2 (two) times daily for 7 days. 05/01/23 05/08/23 Yes Jeannie Fend, PA-C  penicillin v potassium (VEETID) 500 MG tablet Take 1 tablet (500 mg total) by mouth 4 (four) times daily for 10 days. 05/01/23 05/11/23 Yes Jeannie Fend, PA-C  albuterol (VENTOLIN HFA) 108 (90 Base) MCG/ACT inhaler Inhale 2 puffs into the lungs every 4 (four) hours as needed for wheezing or shortness of breath. 01/17/23   Zenia Resides, MD  benzonatate (TESSALON) 100 MG capsule Take 1 capsule (100 mg total) by mouth 3 (three) times daily as needed for cough. 01/17/23   Zenia Resides, MD      Allergies    Patient has no known allergies.    Review of Systems   Review of Systems Negative except as per HPI Physical Exam Updated Vital Signs BP (!) 190/110   Pulse 66   Temp 97.7 F (36.5 C) (Oral)   Resp 16   Ht 5\' 8"  (1.727 m)   Wt 74.8 kg   SpO2 98%   BMI 25.09 kg/m  Physical Exam Vitals and nursing note reviewed.  Constitutional:      General: He is not in acute distress.    Appearance: He is well-developed. He is not diaphoretic.  HENT:     Head: Normocephalic and atraumatic.     Jaw: No trismus.     Nose: Nose normal.     Mouth/Throat:     Mouth: Mucous membranes are moist.     Dentition: Dental caries present.     Comments: Poor dental hygiene, no trismus.  No obvious abscess.  Has right lower molar which has decayed to the gumline.  Gingiva in this area  appears inflamed Pulmonary:     Effort: Pulmonary effort is normal.  Lymphadenopathy:     Cervical: No cervical adenopathy.  Neurological:     Mental Status: He is alert and oriented to person, place, and time.  Psychiatric:        Behavior: Behavior normal.     ED Results / Procedures / Treatments   Labs (all labs ordered are listed, but only abnormal results are displayed) Labs Reviewed - No data to display  EKG None  Radiology No results found.  Procedures Procedures    Medications Ordered in ED Medications - No data to display  ED Course/ Medical Decision Making/ A&P                                 Medical Decision Making Risk Prescription drug management.   57 year old male with right lower dental pain.  States that he has several teeth that are going bad but has been having pain in the right lower tooth for some time.  Denies trauma, fever, drainage.  Does not have any trismus on exam, no obvious drainable  collection.  Right lower molar has decayed to the gumline, gingiva inflamed.  Will cover with antibiotics.  He is provided with topical lidocaine to the area with some improvement in his pain, discharged home with same.  Prescription for antibiotic sent to pharmacy as well as diclofenac.  Advised to use diclofenac if the Advil Liqui-Gels and Tylenol are not controlling his pain.  Provided with dental resource guide.        Final Clinical Impression(s) / ED Diagnoses Final diagnoses:  Pain, dental    Rx / DC Orders ED Discharge Orders          Ordered    penicillin v potassium (VEETID) 500 MG tablet  4 times daily        05/01/23 2340    diclofenac (VOLTAREN) 25 MG EC tablet  2 times daily        05/01/23 2340              Alden Hipp 05/02/23 0019    Glendora Score, MD 05/02/23 [phone]

## 2023-09-30 ENCOUNTER — Other Ambulatory Visit: Payer: Self-pay

## 2023-09-30 ENCOUNTER — Encounter (HOSPITAL_COMMUNITY): Payer: Self-pay

## 2023-09-30 ENCOUNTER — Ambulatory Visit (HOSPITAL_COMMUNITY)
Admission: RE | Admit: 2023-09-30 | Discharge: 2023-09-30 | Disposition: A | Discharge status: Home / Self Care | Payer: No Typology Code available for payment source | Source: Ambulatory Visit | Attending: Family Medicine | Admitting: Family Medicine

## 2023-09-30 ENCOUNTER — Telehealth (HOSPITAL_COMMUNITY): Payer: Self-pay

## 2023-09-30 ENCOUNTER — Ambulatory Visit (INDEPENDENT_AMBULATORY_CARE_PROVIDER_SITE_OTHER): Payer: No Typology Code available for payment source

## 2023-09-30 VITALS — BP 133/88 | HR 73 | Temp 98.0°F | Resp 18

## 2023-09-30 DIAGNOSIS — R911 Solitary pulmonary nodule: Secondary | ICD-10-CM

## 2023-09-30 DIAGNOSIS — Z720 Tobacco use: Secondary | ICD-10-CM

## 2023-09-30 NOTE — ED Triage Notes (Signed)
Pt presents wanting hi BP checked and a physical done.

## 2023-09-30 NOTE — Telephone Encounter (Signed)
Received call from [facility] Radiology stating a CT chest was recommended due to increase in nodule size.  Findings relayed to P. Marlinda Mike, MD.  Per MD, "yes, we are going to get him another primary care appt, hopefully one that takes mcd this time (I saw him in May when we first found the nodule). Thanks."

## 2023-09-30 NOTE — Discharge Instructions (Signed)
The chest x-ray shows that the small knot in your chest to have enlarged a little bit since we did your chest x-ray in May.  Once you have an appointment with another primary care, please call Medicaid for them to sign that provider to you  You can use the QR code/website at the back of the summary paperwork to schedule yourself a new patient appointment with primary care

## 2023-09-30 NOTE — ED Provider Notes (Signed)
[facility]    CSN: [id number] Arrival date & time: 09/30/23  1448      History   Chief Complaint Chief Complaint  Patient presents with   Hypertension    Entered by patient    HPI Evan Bennett is a 58 y.o. male.   Here for an elevated blood pressure reading at another clinic.  He has gone there for a physical.  He comes here for [place] to verify whether or not his blood pressure was elevated.  He has not had any chest pain or headache or swelling.  I saw this patient in May 2024 and he had a pulmonary nodule on his chest x-ray.  It was done for some congestion and shortness of breath.  He states that shortness of breath is better overall.  He does smoke tobacco  At the time he was seen in May, we made him a primary care appointment for follow-up and to further evaluate the pulmonary nodule.  He was unable to attend that appointment because that clinic did not take his Ambetter that he had at that time  He now has Medicaid and would like to follow-up with a primary care provider, establish and evaluate the pulmonary nodule further    Past Medical History:  Diagnosis Date   Depression    Hypertension     Patient Active Problem List   Diagnosis Date Noted   Tobacco abuse 09/09/2011    History reviewed. No pertinent surgical history.     Home Medications    Prior to Admission medications   Medication Sig Start Date End Date Taking? Authorizing Provider  albuterol (VENTOLIN HFA) 108 (90 Base) MCG/ACT inhaler Inhale 2 puffs into the lungs every 4 (four) hours as needed for wheezing or shortness of breath. 01/17/23   Zenia Resides, MD    Family History Family History  Problem Relation Age of Onset   Alzheimer's disease Mother    Hypertension Mother    Lung cancer Father    Hypertension Brother     Social History Social History   Tobacco Use   Smoking status: Every Day    Current packs/day: 1.00    Types: Cigarettes  Vaping Use   Vaping  status: Never Used  Substance Use Topics   Alcohol use: Not Currently   Drug use: Never     Allergies   Patient has no known allergies.   Review of Systems Review of Systems   Physical Exam Triage Vital Signs ED Triage Vitals  Encounter Vitals Group     BP 09/30/23 1526 133/88     Systolic BP Percentile --      Diastolic BP Percentile --      Pulse Rate 09/30/23 1526 73     Resp 09/30/23 1526 18     Temp 09/30/23 1526 98 F (36.7 C)     Temp src --      SpO2 09/30/23 1526 99 %     Weight --      Height --      Head Circumference --      Peak Flow --      Pain Score 09/30/23 1523 0     Pain Loc --      Pain Education --      Exclude from Growth Chart --    No data found.  Updated Vital Signs BP 133/88   Pulse 73   Temp 98 F (36.7 C)   Resp 18  SpO2 99%   Visual Acuity Right Eye Distance:   Left Eye Distance:   Bilateral Distance:    Right Eye Near:   Left Eye Near:    Bilateral Near:     Physical Exam Vitals reviewed.  Constitutional:      General: He is not in acute distress.    Appearance: He is not ill-appearing, toxic-appearing or diaphoretic.  HENT:     Mouth/Throat:     Mouth: Mucous membranes are moist.  Eyes:     Extraocular Movements: Extraocular movements intact.     Pupils: Pupils are equal, round, and reactive to light.  Cardiovascular:     Rate and Rhythm: Normal rate and regular rhythm.     Heart sounds: No murmur heard. Pulmonary:     Effort: Pulmonary effort is normal. No respiratory distress.     Breath sounds: Normal breath sounds. No stridor. No wheezing, rhonchi or rales.  Chest:     Chest wall: No tenderness.  Musculoskeletal:     Cervical back: Neck supple.  Lymphadenopathy:     Cervical: No cervical adenopathy.  Skin:    Coloration: Skin is not pale.  Neurological:     General: No focal deficit present.     Mental Status: He is alert and oriented to person, place, and time.  Psychiatric:        Behavior:  Behavior normal.      UC Treatments / Results  Labs (all labs ordered are listed, but only abnormal results are displayed) Labs Reviewed - No data to display  EKG   Radiology DG Chest 2 View Result Date: 09/30/2023 CLINICAL DATA:  Pulmonary nodule. EXAM: CHEST - 2 VIEW COMPARISON:  01/17/2023 FINDINGS: The heart size and mediastinal contours are within normal limits. Pulmonary nodular opacity seen in the retrosternal clear space on the lateral view currently measures 8 mm compared to 6 mm previously. No evidence of pulmonary infiltrate or pleural effusion. IMPRESSION: Slight increase in size of 8 mm nodular opacity in retrosternal clear space, slightly increased in size since 01/17/2023. Recommend chest CT without contrast for further evaluation. These results will be called to the ordering clinician or representative by the Radiologist Assistant, and communication documented in the PACS or [employer]. Electronically Signed   By: Danae Orleans M.D.   On: 09/30/2023 16:16    Procedures Procedures (including critical care time)  Medications Ordered in UC Medications - No data to display  Initial Impression / Assessment and Plan / UC Course  I have reviewed the triage vital signs and the nursing notes.  Pertinent labs & imaging results that were available during my care of the patient were reviewed by me and considered in my medical decision making (see chart for details).   Chest x-ray shows a slight increase in the size of the pulmonary nodule noted in May.  It now measures about 8 mm and this is increased from 6 mm  Staff will make him an appointment with a primary care provider and he will call Medicaid and get that provider assigned as his primary care  He is aware that this could be a serious condition and will follow-up. Final Clinical Impressions(s) / UC Diagnoses   Final diagnoses:  Solitary pulmonary nodule     Discharge Instructions      The chest x-ray shows  that the small knot in your chest to have enlarged a little bit since we did your chest x-ray in May.  Once you have an  appointment with another primary care, please call Medicaid for them to sign that provider to you  You can use the QR code/website at the back of the summary paperwork to schedule yourself a new patient appointment with primary care      ED Prescriptions   None    PDMP not reviewed this encounter.   Zenia Resides, MD 09/30/23 1630

## 2023-10-01 ENCOUNTER — Emergency Department (HOSPITAL_COMMUNITY)
Admission: EM | Admit: 2023-10-01 | Discharge: 2023-10-01 | Disposition: A | Discharge status: Home / Self Care | Payer: No Typology Code available for payment source | Attending: Emergency Medicine | Admitting: Emergency Medicine

## 2023-10-01 ENCOUNTER — Other Ambulatory Visit: Payer: Self-pay

## 2023-10-01 DIAGNOSIS — R0789 Other chest pain: Secondary | ICD-10-CM | POA: Diagnosis not present

## 2023-10-01 DIAGNOSIS — R911 Solitary pulmonary nodule: Secondary | ICD-10-CM | POA: Diagnosis present

## 2023-10-01 LAB — CBC WITH DIFFERENTIAL/PLATELET
Abs Immature Granulocytes: 0.04 10*3/uL (ref 0.00–0.07)
Basophils Absolute: 0.1 10*3/uL (ref 0.0–0.1)
Basophils Relative: 1 %
Eosinophils Absolute: 0.2 10*3/uL (ref 0.0–0.5)
Eosinophils Relative: 2 %
HCT: 46.3 % (ref 39.0–52.0)
Hemoglobin: 15.5 g/dL (ref 13.0–17.0)
Immature Granulocytes: 0 %
Lymphocytes Relative: 18 %
Lymphs Abs: 2.1 10*3/uL (ref 0.7–4.0)
MCH: 30.6 pg (ref 26.0–34.0)
MCHC: 33.5 g/dL (ref 30.0–36.0)
MCV: 91.3 fL (ref 80.0–100.0)
Monocytes Absolute: 0.8 10*3/uL (ref 0.1–1.0)
Monocytes Relative: 7 %
Neutro Abs: 8.2 10*3/uL — ABNORMAL HIGH (ref 1.7–7.7)
Neutrophils Relative %: 72 %
Platelets: 245 10*3/uL (ref 150–400)
RBC: 5.07 MIL/uL (ref 4.22–5.81)
RDW: 12.9 % (ref 11.5–15.5)
WBC: 11.5 10*3/uL — ABNORMAL HIGH (ref 4.0–10.5)
nRBC: 0 % (ref 0.0–0.2)

## 2023-10-01 LAB — BASIC METABOLIC PANEL
Anion gap: 9 (ref 5–15)
BUN: 6 mg/dL (ref 6–20)
CO2: 26 mmol/L (ref 22–32)
Calcium: 9.1 mg/dL (ref 8.9–10.3)
Chloride: 102 mmol/L (ref 98–111)
Creatinine, Ser: 1.05 mg/dL (ref 0.61–1.24)
GFR, Estimated: >60 mL/min (ref >60)
Glucose, Bld: 120 mg/dL — ABNORMAL HIGH (ref 70–99)
Potassium: 3.7 mmol/L (ref 3.5–5.1)
Sodium: 137 mmol/L (ref 135–145)

## 2023-10-01 NOTE — ED Provider Notes (Signed)
[place] EMERGENCY DEPARTMENT AT [facility] Provider Note   CSN: [id number] Arrival date & time: 10/01/23  1037     History  Chief Complaint  Patient presents with   chest congestion   xrayed needed    Evan Bennett is a 58 y.o. male.  Patient to ED for evaluation and concern for lung nodule found on chest xray performed at Urgent Care. He and spouse report being seen in May when the nodule was originally seen, and appeared to have enlarged on xray performed yesterday. No SOB, chest pain, vomiting, fever, cough.   The history is provided by the patient and the spouse.       Home Medications Prior to Admission medications   Medication Sig Start Date End Date Taking? Authorizing Provider  albuterol (VENTOLIN HFA) 108 (90 Base) MCG/ACT inhaler Inhale 2 puffs into the lungs every 4 (four) hours as needed for wheezing or shortness of breath. 01/17/23   Zenia Resides, MD      Allergies    Patient has no known allergies.    Review of Systems   Review of Systems  Physical Exam Updated Vital Signs BP (!) 140/88 (BP Location: Right Arm)   Pulse 80   Temp 98.1 F (36.7 C)   Resp 18   Ht 5\' 11"  (1.803 m)   Wt 72.6 kg   SpO2 98%   BMI 22.32 kg/m  Physical Exam Vitals and nursing note reviewed.  Constitutional:      Appearance: He is well-developed.  Cardiovascular:     Rate and Rhythm: Normal rate.  Pulmonary:     Effort: Pulmonary effort is normal.     Breath sounds: No wheezing, rhonchi or rales.  Musculoskeletal:        General: Normal range of motion.     Cervical back: Normal range of motion.  Skin:    General: Skin is warm and dry.  Neurological:     Mental Status: He is alert and oriented to person, place, and time.     ED Results / Procedures / Treatments   Labs (all labs ordered are listed, but only abnormal results are displayed) Labs Reviewed  CBC WITH DIFFERENTIAL/PLATELET - Abnormal; Notable for the following components:       Result Value   WBC 11.5 (*)    Neutro Abs 8.2 (*)    All other components within normal limits  BASIC METABOLIC PANEL - Abnormal; Notable for the following components:   Glucose, Bld 120 (*)    All other components within normal limits    EKG None  Radiology DG Chest 2 View Result Date: 09/30/2023 CLINICAL DATA:  Pulmonary nodule. EXAM: CHEST - 2 VIEW COMPARISON:  01/17/2023 FINDINGS: The heart size and mediastinal contours are within normal limits. Pulmonary nodular opacity seen in the retrosternal clear space on the lateral view currently measures 8 mm compared to 6 mm previously. No evidence of pulmonary infiltrate or pleural effusion. IMPRESSION: Slight increase in size of 8 mm nodular opacity in retrosternal clear space, slightly increased in size since 01/17/2023. Recommend chest CT without contrast for further evaluation. These results will be called to the ordering clinician or representative by the Radiologist Assistant, and communication documented in the PACS or [employer]. Electronically Signed   By: Danae Orleans M.D.   On: 09/30/2023 16:16    Procedures Procedures    Medications Ordered in ED Medications - No data to display  ED Course/ Medical Decision Making/ A&P  Clinical Course as of 10/01/23 1752  Wed Oct 01, 2023  1750 Patient to ED for evaluation of lung nodule found on CXR in May and repeat CXR yesterday. Patient is asymptomatic. Chart reviewed, and per Urgent Care notation, the patient was referred to primary care for ongoing medical evaluation, however, the patient states he is having difficulty getting established with PCP secondary to insurance issues. No urgent condition identified today to indicate further evaluation. I will provide referral to [facility] and [facility] for outpatient care. Patient and spouse comfortable with plan.  [SU]    Clinical Course User Index [SU] Elpidio Anis, PA-C                                 Medical  Decision Making          Final Clinical Impression(s) / ED Diagnoses Final diagnoses:  Lung nodule    Rx / DC Orders ED Discharge Orders     None         [name] 10/01/23 1752    Lonell Grandchild, MD 10/02/23 1941

## 2023-10-01 NOTE — ED Triage Notes (Signed)
Pt. Stated, I was sent here because I have a solitary lung nodule and need more pictures.

## 2023-10-01 NOTE — Discharge Instructions (Signed)
You can call the resources provided for follow up and further investigation of the lung nodule seen on your chest xray.   It is recommended that you discuss smoking cessation with a primary care provider as well.

## 2023-10-27 ENCOUNTER — Ambulatory Visit: Payer: No Typology Code available for payment source | Admitting: Internal Medicine

## 2023-11-06 ENCOUNTER — Encounter: Payer: Self-pay | Admitting: Emergency Medicine

## 2023-11-06 ENCOUNTER — Ambulatory Visit: Payer: MEDICAID | Admitting: Emergency Medicine

## 2023-11-06 VITALS — BP 138/80 | HR 80 | Temp 97.7°F | Ht 71.0 in | Wt 165.4 lb

## 2023-11-06 DIAGNOSIS — R053 Chronic cough: Secondary | ICD-10-CM | POA: Diagnosis not present

## 2023-11-06 DIAGNOSIS — Z720 Tobacco use: Secondary | ICD-10-CM | POA: Diagnosis not present

## 2023-11-06 DIAGNOSIS — R911 Solitary pulmonary nodule: Secondary | ICD-10-CM | POA: Diagnosis not present

## 2023-11-06 NOTE — Patient Instructions (Addendum)
 VISIT SUMMARY:  Today, we discussed the findings of a pulmonary nodule on your chest x-ray, which has slightly increased in size over the past eight months. We also reviewed your history of chronic cough and smoking, and talked about the next steps for further evaluation and management.  YOUR PLAN:  -PULMONARY NODULE: A pulmonary nodule is a small, round growth in the lung that can be benign or malignant. We will order a CT scan of your chest to get a clearer picture of the nodule and determine the next steps based on the results.  We will also check blood work today to see if you have ever been exposed to tuberculosis.  -CHRONIC COUGH: Chronic cough is a persistent cough that can be caused by various factors, including smoking. We will conduct pulmonary function tests to check for conditions like COPD and may consider prescribing an inhaler based on the test results.  -TOBACCO USE: Long-term smoking can lead to various health issues, including lung disease. We discussed the benefits of quitting smoking and strategies to help you reduce your cigarette use gradually.  INSTRUCTIONS:  Please schedule a CT scan of your chest as soon as possible. Additionally, we will arrange for pulmonary function tests to evaluate your lung function. Follow up with [place] to discuss the results and next steps.

## 2023-11-06 NOTE — Assessment & Plan Note (Signed)
 Chronic Cough History of productive cough, particularly when ill. Coarse breath sounds on exam, possibly related to smoking history. -Order pulmonary function tests to evaluate for possible COPD. -Consider trial of inhaler medication based on PFT results.

## 2023-11-06 NOTE — Assessment & Plan Note (Signed)
 Tobacco Use Longstanding history of smoking, approximately 1 pack per day for 38 years. Discussed the benefits of smoking cessation and strategies for reducing cigarette use. -Encourage gradual reduction in daily cigarette use as a step towards cessation.

## 2023-11-06 NOTE — Progress Notes (Signed)
 Subjective:    Patient ID: Evan Bennett, male    DOB: Sep 20, 1965, 58 y.o.   MRN: [id number]  HPI Evan Bennett is a 58 year old male who presents with a pulmonary nodule noted on chest x-ray. He is accompanied by his long-time girlfriend, who he refers to as his fiance.  A pulmonary nodule was initially identified on a chest x-ray in May 2024 during an emergency department visit for a cough associated with a viral syndrome. The x-ray showed a 6 mm nodular density over the anterior upper lobes. A follow-up chest x-ray on September 30, 2023, revealed a pulmonary nodular opacity in the retrosternal space, approximately 8 mm in size, indicating slight growth over the months.  He has a history of tobacco use since age 58, smoking approximately one pack per day for 38 years. 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 There is a family history of lung cancer, with his father having had the disease.  He has a history of hypertension, with a notable incident in January 2025 when his blood pressure was recorded at 177/100, though he believes this may have been a false reading. He has not been on any inhaler medication consistently, though he was prescribed one in the past.  He experiences occasional chest pain and shortness of breath with strenuous activity. No persistent cough unless sick, as was the case in May 2024 with significant cough and phlegm production. He denies any history of tuberculosis exposure or positive TB tests.   RADIOLOGY Chest x-ray (01/2023): 6 mm nodular density projecting over the anterior upper lobes on lateral view. Chest x-ray (09/30/2023): 8 mm pulmonary nodular opacity in the retrosternal space on the lateral view.   Review of Systems As per HPI  Past Medical History:  Diagnosis Date   Depression    Hypertension      Family  History  Problem Relation Age of Onset   Alzheimer's disease Mother    Hypertension Mother    Lung cancer Father    Hypertension Brother    - Patient's father had lung cancer  Social History   Socioeconomic History   Marital status: Single    Spouse name: Not on file   Number of children: Not on file   Years of education: Not on file   Highest education level: Not on file  Occupational History   Not on file  Tobacco Use   Smoking status: Every Day    Current packs/day: 1.00    Types: Cigarettes   Smokeless tobacco: Not on file  Vaping Use   Vaping status: Never Used  Substance and Sexual Activity   Alcohol use: Not Currently   Drug use: Never   Sexual activity: Not on file  Other Topics Concern   Not on file  Social History Narrative   Not on file   Social Drivers of Health   Financial Resource Strain: Not on file  Food Insecurity: Not on file  Transportation Needs: Not on file  Physical Activity: Not on file  Stress: Not on file  Social Connections: Not on file  Intimate Partner Violence: Not on file   - Smoking for 38 years, 1 pack a day  No Known Allergies   Outpatient Medications Prior to Visit  Medication Sig Dispense Refill   albuterol (VENTOLIN HFA) 108 (90 Base) MCG/ACT inhaler Inhale 2 puffs into the  lungs every 4 (four) hours as needed for wheezing or shortness of breath. (Patient not taking: Reported on 11/06/2023) 1 each 0   No facility-administered medications prior to visit.        Objective:   Physical Exam  Vitals:   11/06/23 1407  BP: 138/80  Pulse: 80  Temp: 97.7 F (36.5 C)  TempSrc: Temporal  SpO2: 95%  Weight: 165 lb 6.4 oz (75 kg)  Height: 5\' 11"  (1.803 m)   Gen: Pleasant, well-nourished, in no distress,  normal affect  ENT: No lesions,  mouth clear,  oropharynx clear, no postnasal drip  Neck: No JVD, no stridor  Lungs: No use of accessory muscles, coarse bilateral breath sounds especially on expiration.  No overt  crackles or wheezing  Cardiovascular: RRR, heart sounds normal, no murmur or gallops, no peripheral edema  Musculoskeletal: No deformities, subcutaneous nodules on both forearms  Neuro: alert, awake, non focal  Skin: Warm, no lesions or rash      Assessment & Plan:  Pulmonary nodule Pulmonary Nodule Nodule noted on chest x-ray, initially 6mm, increased to 8mm over 8 months. Patient has significant smoking history. Discussed the differential diagnosis including benign causes (granuloma, scar) and malignancy. -Order CT chest for further characterization of the nodule. -Check QuantiFERON gold today -Discuss results and plan further evaluation based on CT findings.   Tobacco abuse Tobacco Use Longstanding history of smoking, approximately 1 pack per day for 38 years. Discussed the benefits of smoking cessation and strategies for reducing cigarette use. -Encourage gradual reduction in daily cigarette use as a step towards cessation.  Chronic cough Chronic Cough History of productive cough, particularly when ill. Coarse breath sounds on exam, possibly related to smoking history. -Order pulmonary function tests to evaluate for possible COPD. -Consider trial of inhaler medication based on PFT results.    Levy Pupa, MD, PhD 11/06/2023, 2:40 PM [facility] [phone] or if no answer before 7:00PM call [phone] For any issues after 7:00PM please call eLink [phone] Molli Knock so she

## 2023-11-06 NOTE — Assessment & Plan Note (Signed)
 Pulmonary Nodule Nodule noted on chest x-ray, initially 6mm, increased to 8mm over 8 months. Patient has significant smoking history. Discussed the differential diagnosis including benign causes (granuloma, scar) and malignancy. -Order CT chest for further characterization of the nodule. -Check QuantiFERON gold today -Discuss results and plan further evaluation based on CT findings.

## 2023-11-09 LAB — QUANTIFERON-TB GOLD PLUS
Mitogen-NIL: >10 [IU]/mL
NIL: 0.09 [IU]/mL
QuantiFERON-TB Gold Plus: NEGATIVE
TB1-NIL: 0.1 [IU]/mL
TB2-NIL: 0.05 [IU]/mL

## 2023-12-01 ENCOUNTER — Ambulatory Visit
Admission: RE | Admit: 2023-12-01 | Discharge: 2023-12-01 | Disposition: A | Discharge status: Home / Self Care | Payer: MEDICAID | Source: Ambulatory Visit | Attending: Emergency Medicine | Admitting: Emergency Medicine

## 2023-12-01 DIAGNOSIS — R911 Solitary pulmonary nodule: Secondary | ICD-10-CM

## 2023-12-16 ENCOUNTER — Encounter: Payer: Self-pay | Admitting: Adult Health

## 2023-12-16 ENCOUNTER — Ambulatory Visit (INDEPENDENT_AMBULATORY_CARE_PROVIDER_SITE_OTHER): Payer: MEDICAID | Admitting: Adult Health

## 2023-12-16 VITALS — BP 128/70 | HR 69 | Temp 97.8°F | Ht 71.0 in | Wt 164.8 lb

## 2023-12-16 DIAGNOSIS — R490 Dysphonia: Secondary | ICD-10-CM | POA: Diagnosis not present

## 2023-12-16 DIAGNOSIS — R911 Solitary pulmonary nodule: Secondary | ICD-10-CM | POA: Diagnosis not present

## 2023-12-16 DIAGNOSIS — R229 Localized swelling, mass and lump, unspecified: Secondary | ICD-10-CM

## 2023-12-16 DIAGNOSIS — J439 Emphysema, unspecified: Secondary | ICD-10-CM

## 2023-12-16 MED ORDER — ALBUTEROL SULFATE HFA 108 (90 BASE) MCG/ACT IN AERS: 1.0000 | INHALATION_SPRAY | Freq: Four times a day (QID) | RESPIRATORY_TRACT | 3 refills | Status: DC | PRN

## 2023-12-16 NOTE — Patient Instructions (Addendum)
 CT chest in 4 months.  Work on not smoking  Albuterol inhaler As needed   PFTs as planned.  Refer to ENT for chronic hoarseness.  Refer to Primary Care to establish.  Follow up with Dr. Delton Coombes  in 5 months and As needed

## 2023-12-16 NOTE — Progress Notes (Signed)
 @Patient  ID: Evan Bennett, male    DOB: [birth year], 58 y.o.   MRN: [id number]  Chief Complaint  Patient presents with   Follow-up    Referring provider: No ref. provider found  HPI: 58 yo male smoker seen for pulmonary consult 11/06/23 for lung nodule   TEST/EVENTS :  Chest x-ray (01/2023): 6 mm nodular density projecting over the anterior upper lobes on lateral view. Chest x-ray (09/30/2023): 8 mm pulmonary nodular opacity in the retrosternal space on the lateral view.  12/16/23 Follow up : Lung nodule  Discussed the use of AI scribe software for clinical note transcription with the patient, who gave verbal consent to proceed.  History of Present Illness   Evan Bennett is a 58 year old male who presents for follow-up to discuss CT results and evaluate for COPD.  Seen for pulmonary consult in February for abnormal chest xray. 8 mm nodule retrosternal space on lateral view. He was set up for a CT chest . 12/01/23 CT scan revealed two nodules in the left upper lung, measuring six millimeters and three millimeters, respectively. Emphysema and diffuse bronchial wall thickening along with ground glass nodularity upper lobe predominance favored smoking related respiratory bronchiolitis.  We reviewed results and detail . Discussed surveillance CT chest in 4 months. He was set up for PFT but are still pending.  He continues to smoke and experiences frequent expectoration of mucus. He does not use any medications for COPD.He experiences occasional shortness of breath and a raspy voice change over the past year.  He has a family history of lung cancer, with his grandfather, father, and brother having died from the disease. His brother's exposure to Agent Orange during [employer] .  He reports the presence of nodules on his arms, which he suspects might be gout, although he has not been diagnosed with it before. These nodules appear and disappear.  He also mentions past episodes of leg  swelling that resolved spontaneously. He does not have a primary care physician and is considering seeing a dermatologist or primary care physician for further evaluation.       No Known Allergies   There is no immunization history on file for this patient.  Past Medical History:  Diagnosis Date   Depression    Hypertension     Tobacco History: Social History   Tobacco Use  Smoking Status Every Day   Current packs/day: 1.00   Types: Cigarettes  Smokeless Tobacco Not on file  Tobacco Comments   Smoking 1 ppd.  12/16/2023 hfb   Ready to quit: Not Answered Counseling given: Not Answered Tobacco comments: Smoking 1 ppd.  12/16/2023 hfb   Outpatient Medications Prior to Visit  Medication Sig Dispense Refill   albuterol (VENTOLIN HFA) 108 (90 Base) MCG/ACT inhaler Inhale 2 puffs into the lungs every 4 (four) hours as needed for wheezing or shortness of breath. (Patient not taking: Reported on 12/16/2023) 1 each 0   No facility-administered medications prior to visit.     Review of Systems:   Constitutional:   No  weight loss, night sweats,  Fevers, chills,+ fatigue, or  lassitude.  HEENT:   No headaches,  Difficulty swallowing,  Tooth/dental problems, or  Sore throat,                No sneezing, itching, ear ache, nasal congestion, post nasal drip,   CV:  No chest pain,  Orthopnea, PND, swelling in lower extremities, anasarca, dizziness, palpitations, syncope.  GI  No heartburn, indigestion, abdominal pain, nausea, vomiting, diarrhea, change in bowel habits, loss of appetite, bloody stools.   Resp:   No chest wall deformity  Skin: no rash or lesions.  GU: no dysuria, change in color of urine, no urgency or frequency.  No flank pain, no hematuria   MS:  No joint pain or swelling.  No decreased range of motion.  No back pain.    Physical Exam  BP 128/70 (BP Location: Left Arm, Patient Position: Sitting, Cuff Size: Normal)   Pulse 69   Temp 97.8 F (36.6 C) (Oral)    Ht 5\' 11"  (1.803 m)   Wt 164 lb 12.8 oz (74.8 kg)   SpO2 98%   BMI 22.98 kg/m   GEN: A/Ox3; pleasant , NAD, well nourished    HEENT:  [place]/AT,   NOSE-clear, THROAT-clear, no lesions, no postnasal drip or exudate noted.   NECK:  Supple w/ fair ROM; no JVD; normal carotid impulses w/o bruits; no thyromegaly or nodules palpated; no lymphadenopathy.    RESP  Clear  P & A; w/o, wheezes/ rales/ or rhonchi. no accessory muscle use, no dullness to percussion  CARD:  RRR, no m/r/g, no peripheral edema, pulses intact, no cyanosis or clubbing.  GI:   Soft & nt; nml bowel sounds; no organomegaly or masses detected.   Musco: Warm bil, no deformities or joint swelling noted.   Neuro: alert, no focal deficits noted.    Skin: Warm, no lesions or rashes    Lab Results:    BNP No results found for: "BNP"  ProBNP No results found for: "PROBNP"  Imaging: CT Super D Chest Wo Contrast Result Date: 12/15/2023 CLINICAL DATA:  Evaluate for pulmonary nodule. Abnormal chest radiograph. EXAM: CT CHEST WITHOUT CONTRAST TECHNIQUE: Multidetector CT imaging of the chest was performed using thin slice collimation for electromagnetic bronchoscopy planning purposes, without intravenous contrast. RADIATION DOSE REDUCTION: This exam was performed according to the departmental dose-optimization program which includes automated exposure control, adjustment of the mA and/or kV according to patient size and/or use of iterative reconstruction technique. COMPARISON:  None Available. FINDINGS: Cardiovascular: Normal heart size. No pericardial effusion. Aortic atherosclerosis and coronary artery calcification. Mediastinum/Nodes: No enlarged mediastinal, hilar, or axillary lymph nodes. Thyroid gland, trachea, and esophagus demonstrate no significant findings. Lungs/Pleura: Emphysema and diffuse bronchial wall thickening. Scattered ground-glass centrilobular nodularity is noted within upper lung zone predominance. Subpleural  nodule within the left upper lobe adjacent to the posterior aortic arch measures 6 mm, image 36/8. Peripheral nodule in the anterolateral left upper lobe measures 3 mm, image 26/8. Upper Abdomen: No acute abnormality. Musculoskeletal: Chronic, healed left fifth through eighth left rib fractures. No acute or suspicious osseous findings. IMPRESSION: 1. There are 2 left upper lobe pulmonary nodules, the largest measuring 6 mm. Non-contrast chest CT at 3-6 months is recommended. If the nodules are stable at time of repeat CT, then future CT at 18-24 months (from today's scan) is considered optional for low-risk patients, but is recommended for high-risk patients. This recommendation follows the consensus statement: Guidelines for Management of Incidental Pulmonary Nodules Detected on CT Images:From the Fleischner Society 2017; published online before print (10.1148/radiol.[id number]). 2. Scattered ground-glass centrilobular nodularity within upper lung zone predominance. Findings are favored to represent smoking-related respiratory bronchiolitis. 3. Coronary artery calcifications. 4. Aortic Atherosclerosis (ICD10-I70.0) and Emphysema (ICD10-J43.9). Electronically Signed   By: Kimberley Penman M.D.   On: 12/15/2023 06:50    Administration History     None  No data to display          No results found for: "NITRICOXIDE"      Assessment & Plan:    Assessment and Plan    Lung nodules   Two small nodules in the left upper lung, measuring 6 mm and 3 mm, Currently  too small for biopsy /PET scan. At risk for for malignancy with heavy smoking history. Continued monitoring is necessary to assess for growth or changes. If growth is observed, further evaluation with a PET scan and possible biopsy may be considered, although current nodule size is below the threshold for accurate PET scan results. Order a follow-up chest CT in four months to monitor nodules. Schedule a follow-up appointment with  Dr. Baldwin Levee in five months to discuss CT results.  Emphysema   Imaging shows emphysema, likely secondary to smoking, characterized by loss of lung elasticity, decreased lung function, and increased risk of COPD progression. Smoking cessation is crucial to prevent further lung damage and disease progression. Schedule a pulmonary function test (PFT) to assess lung capacity and determine COPD stage. Encourage smoking cessation to prevent further lung damage. Prescribe an albuterol inhaler for symptomatic relief of cough and wheezing.  Voice changes   Persistent hoarseness and raspy voice for several months, possibly related to smoking, require further evaluation to rule out vocal cord pathology. Refer to an ENT specialist for evaluation of vocal cords.  Unexplained skin nodules   Nodules on arms and elbows with intermittent swelling suggest a differential diagnosis including gout, rheumatoid nodules, or other dermatological conditions. Further evaluation is needed. Refer to primary care for further evaluation and management of skin nodules.          Roena Clark, NP 12/16/2023

## 2024-01-08 ENCOUNTER — Ambulatory Visit (HOSPITAL_BASED_OUTPATIENT_CLINIC_OR_DEPARTMENT_OTHER): Payer: MEDICAID | Admitting: Emergency Medicine

## 2024-01-08 DIAGNOSIS — R911 Solitary pulmonary nodule: Secondary | ICD-10-CM

## 2024-01-08 LAB — PULMONARY FUNCTION TEST
DL/VA % pred: 97 %
DL/VA: 4.15 ml/min/mmHg/L
DLCO cor % pred: 88 %
DLCO cor: 24.82 ml/min/mmHg
DLCO unc % pred: 88 %
DLCO unc: 24.82 ml/min/mmHg
FEF 25-75 Post: 2.33 L/s
FEF 25-75 Pre: 2.23 L/s
FEF2575-%Change-Post: 4 %
FEF2575-%Pred-Post: 75 %
FEF2575-%Pred-Pre: 72 %
FEV1-%Change-Post: 1 %
FEV1-%Pred-Post: 80 %
FEV1-%Pred-Pre: 79 %
FEV1-Post: 2.98 L
FEV1-Pre: 2.93 L
FEV1FVC-%Change-Post: -1 %
FEV1FVC-%Pred-Pre: 96 %
FEV6-%Change-Post: 2 %
FEV6-%Pred-Post: 87 %
FEV6-%Pred-Pre: 85 %
FEV6-Post: 4.04 L
FEV6-Pre: 3.93 L
FEV6FVC-%Change-Post: -1 %
FEV6FVC-%Pred-Post: 103 %
FEV6FVC-%Pred-Pre: 104 %
FVC-%Change-Post: 2 %
FVC-%Pred-Post: 84 %
FVC-%Pred-Pre: 82 %
FVC-Post: 4.1 L
FVC-Pre: 3.98 L
Post FEV1/FVC ratio: 73 %
Post FEV6/FVC ratio: 99 %
Pre FEV1/FVC ratio: 74 %
Pre FEV6/FVC Ratio: 100 %
RV % pred: 106 %
RV: 2.33 L
TLC % pred: 94 %
TLC: 6.57 L

## 2024-01-08 NOTE — Progress Notes (Signed)
 Full PFT Performed Today

## 2024-01-08 NOTE — Patient Instructions (Signed)
 Full PFT Performed Today

## 2024-02-05 ENCOUNTER — Telehealth: Payer: Self-pay

## 2024-02-05 NOTE — Telephone Encounter (Signed)
 I reviewed the PFT - overall normal with possible mild obstructed airflow. The report is below:  SPIROMETRY: Possible Mild Airflow Limitation No significant response to bronchodilator.  LUNG VOLUMES: Normal Volumes ( TLC and RV)  DLCO: Normal diffusion capacity.

## 2024-02-05 NOTE — Telephone Encounter (Signed)
 Copied from CRM [id number]. Topic: Clinical - Lab/Test Results >> Feb 05, 2024  1:52 PM Evan Bennett wrote: Reason for CRM: Pt had a PFT completed with Dr. Baldwin Levee at [facility] on 01/08/2024, and he is wanting those results discussed with him. Please call the pt back at [phone] ok to leave a vm.  Dr. Baldwin Levee can you please advise PFT results. Thanks

## 2024-02-06 NOTE — Telephone Encounter (Signed)
 Spoke with patient. Reviewed PFT results. Pt is currently smoking 1pk daily. Advised pt to trying lower amount of cigarettes he is smoking. Wife was in the background during phone call and had multiple questions about CT scan from 3/24.  Relayed information to the pt provided by TP at pts lov "Lung nodules   Two small nodules in the left upper lung, measuring 6 mm and 3 mm, Currently  too small for biopsy /PET scan. At risk for for malignancy with heavy smoking history. Continued monitoring is necessary to assess for growth or changes. If growth is observed, further evaluation with a PET scan and possible biopsy may be considered, although current nodule size is below the threshold for accurate PET scan results. Order a follow-up chest CT in four months to monitor nodules. Schedule a follow-up appointment with Dr. Baldwin Levee in five months to discuss CT results. Emphysema   Imaging shows emphysema, likely secondary to smoking, characterized by loss of lung elasticity, decreased lung function, and increased risk of COPD progression. Smoking cessation is crucial to prevent further lung damage and disease progression. Schedule a pulmonary function test (PFT) to assess lung capacity and determine COPD stage. Encourage smoking cessation to prevent further lung damage. Prescribe an albuterol  inhaler for symptomatic relief of cough and wheezing."  Pt verbalized understanding & had no other concerns. NFN

## 2024-02-19 ENCOUNTER — Encounter (INDEPENDENT_AMBULATORY_CARE_PROVIDER_SITE_OTHER): Payer: Self-pay | Admitting: Otolaryngology

## 2024-02-19 ENCOUNTER — Ambulatory Visit (INDEPENDENT_AMBULATORY_CARE_PROVIDER_SITE_OTHER): Payer: MEDICAID | Admitting: Otolaryngology

## 2024-02-19 VITALS — BP 148/88 | HR 69

## 2024-02-19 DIAGNOSIS — Z720 Tobacco use: Secondary | ICD-10-CM

## 2024-02-19 DIAGNOSIS — F17210 Nicotine dependence, cigarettes, uncomplicated: Secondary | ICD-10-CM

## 2024-02-19 DIAGNOSIS — K148 Other diseases of tongue: Secondary | ICD-10-CM

## 2024-02-19 DIAGNOSIS — J381 Polyp of vocal cord and larynx: Secondary | ICD-10-CM

## 2024-02-19 DIAGNOSIS — D3702 Neoplasm of uncertain behavior of tongue: Secondary | ICD-10-CM

## 2024-02-19 DIAGNOSIS — R490 Dysphonia: Secondary | ICD-10-CM

## 2024-02-19 NOTE — Progress Notes (Signed)
 ENT CONSULT:  Reason for Consult: hoarseness hx of smoking    HPI: Discussed the use of AI scribe software for clinical note transcription with the patient, who gave verbal consent to proceed  History of Present Illness  Discussed the use of AI scribe software for clinical note transcription with the patient, who gave verbal consent to proceed.  History of Present Illness   Evan Bennett is a 58 year old male current smoker, 1 PPD, has been for years who presents with a raspy voice and a sore spot on his tongue which he thinks is due to sharp tooth remnant and biting injury.  He has experienced a raspy voice for at least a year. He smokes approximately a pack of cigarettes a day. No history of cancer or heavy alcohol use. No trouble with swallowing, pain when swallowing, or coughing up blood. He has not noticed any masses or lumps in his neck.  He has a sore spot on his  right lateral tongue, which he attributes to a sharp tooth. He describes the tooth as 'really, really sharp' and notes that the sore spot started when the tooth began rubbing against his tongue. He has had issues with his teeth breaking off and not taking care of them as he used to. He feels the sore spot snag on the tooth when he swallows.       Records Reviewed:  Melia Sprague visit 12/16/23 57 yo male smoker seen for pulmonary consult 11/06/23 for lung nodule    TEST/EVENTS :  Chest x-ray (01/2023): 6 mm nodular density projecting over the anterior upper lobes on lateral view. Chest x-ray (09/30/2023): 8 mm pulmonary nodular opacity in the retrosternal space on the lateral view.   12/16/23 Follow up : Lung nodule  Discussed the use of AI scribe software for clinical note transcription with the patient, who gave verbal consent to proceed.   History of Present Illness   Evan Bennett is a 58 year old male who presents for follow-up to discuss CT results and evaluate for COPD.   Seen for pulmonary consult in February  for abnormal chest xray. 8 mm nodule retrosternal space on lateral view. He was set up for a CT chest . 12/01/23 CT scan revealed two nodules in the left upper lung, measuring six millimeters and three millimeters, respectively. Emphysema and diffuse bronchial wall thickening along with ground glass nodularity upper lobe predominance favored smoking related respiratory bronchiolitis.  We reviewed results and detail . Discussed surveillance CT chest in 4 months. He was set up for PFT but are still pending.  He continues to smoke and experiences frequent expectoration of mucus. He does not use any medications for COPD.He experiences occasional shortness of breath and a raspy voice change over the past year.   He has a family history of lung cancer, with his grandfather, father, and brother having died from the disease. His brother's exposure to Agent Orange during [employer] .   He reports the presence of nodules on his arms, which he suspects might be gout, although he has not been diagnosed with it before. These nodules appear and disappear.  He also mentions past episodes of leg swelling that resolved spontaneously. He does not have a primary care physician and is considering seeing a dermatologist or primary care physician for further evaluation.   Lung nodules   Two small nodules in the left upper lung, measuring 6 mm and 3 mm, Currently  too small for biopsy /PET  scan. At risk for for malignancy with heavy smoking history. Continued monitoring is necessary to assess for growth or changes. If growth is observed, further evaluation with a PET scan and possible biopsy may be considered, although current nodule size is below the threshold for accurate PET scan results. Order a follow-up chest CT in four months to monitor nodules. Schedule a follow-up appointment with Dr. Baldwin Levee in five months to discuss CT results.   Emphysema   Imaging shows emphysema, likely secondary to smoking, characterized by  loss of lung elasticity, decreased lung function, and increased risk of COPD progression. Smoking cessation is crucial to prevent further lung damage and disease progression. Schedule a pulmonary function test (PFT) to assess lung capacity and determine COPD stage. Encourage smoking cessation to prevent further lung damage. Prescribe an albuterol  inhaler for symptomatic relief of cough and wheezing.   Voice changes   Persistent hoarseness and raspy voice for several months, possibly related to smoking, require further evaluation to rule out vocal cord pathology. Refer to an ENT specialist for evaluation of vocal cords   Past Medical History:  Diagnosis Date   Depression    Hypertension     History reviewed. No pertinent surgical history.  Family History  Problem Relation Age of Onset   Alzheimer's disease Mother    Hypertension Mother    Lung cancer Father    Hypertension Brother     Social History:  reports that he has been smoking cigarettes. He does not have any smokeless tobacco history on file. He reports that he does not currently use alcohol. He reports that he does not use drugs.  Allergies: No Known Allergies  Medications: I have reviewed the patient's current medications.  The PMH, PSH, Medications, Allergies, and SH were reviewed and updated.  ROS: Constitutional: Negative for fever, weight loss and weight gain. Cardiovascular: Negative for chest pain and dyspnea on exertion. Respiratory: Is not experiencing shortness of breath at rest. Gastrointestinal: Negative for nausea and vomiting. Neurological: Negative for headaches. Psychiatric: The patient is not nervous/anxious  Blood pressure (!) 148/88, pulse 69, SpO2 94%. There is no height or weight on file to calculate BMI.  PHYSICAL EXAM:  Exam: General: Well-developed, well-nourished Communication and Voice: raspy Respiratory Respiratory effort: Equal inspiration and expiration without  stridor Cardiovascular Peripheral Vascular: Warm extremities with equal color/perfusion Eyes: No nystagmus with equal extraocular motion bilaterally Neuro/Psych/Balance: Patient oriented to person, place, and time; Appropriate mood and affect; Gait is intact with no imbalance; Cranial nerves I-XII are intact Head and Face Inspection: Normocephalic and atraumatic without mass or lesion Palpation: Facial skeleton intact without bony stepoffs Salivary Glands: No mass or tenderness Facial Strength: Facial motility symmetric and full bilaterally ENT Pinna: External ear intact and fully developed External canal: Canal is patent with intact skin Tympanic Membrane: Clear and mobile External Nose: No scar or anatomic deformity Internal Nose: Septum is deviated S-shaped but nasal passages are patent. No polyp, or purulence. Mucosal edema and erythema present.  Bilateral inferior turbinate hypertrophy.  Lips, Teeth, and gums: Mucosa and teeth intact and viable TMJ: No pain to palpation with full mobility Oral cavity/oropharynx: No erythema or exudate, no lesions present Right lateral tongue with an ulcerated mucosa near sharp tooth remnant Nasopharynx: No mass or lesion with intact mucosa Hypopharynx: Intact mucosa without pooling of secretions Larynx Glottic: Full true vocal cord mobility without masses with bilateral Reinke's polyps no overlying leukoplakia Supraglottic: Normal appearing epiglottis and AE folds Interarytenoid Space: Moderate pachydermia&edema Subglottic Space:  Patent without lesion or edema Neck Neck and Trachea: Midline trachea without mass or lesion Thyroid: No mass or nodularity Lymphatics: No lymphadenopathy  Procedure: Preoperative diagnosis: hoarseness hx of smoking  Postoperative diagnosis:   Same Reinke's polyps  Procedure: Flexible fiberoptic laryngoscopy  Surgeon: Kiauna Zywicki, MD  Anesthesia: Topical lidocaine and Afrin Complications: None Condition is  stable throughout exam  Indications and consent:  The patient presents to the clinic with above symptoms. Indirect laryngoscopy view was incomplete. Thus it was recommended that they undergo a flexible fiberoptic laryngoscopy. All of the risks, benefits, and potential complications were reviewed with the patient preoperatively and verbal informed consent was obtained.  Procedure: The patient was seated upright in the clinic. Topical lidocaine and Afrin were applied to the nasal cavity. After adequate anesthesia had occurred, I then proceeded to pass the flexible telescope into the nasal cavity. The nasal cavity was patent without rhinorrhea or polyp. The nasopharynx was also patent without mass or lesion. The base of tongue was visualized and was normal. There were no signs of pooling of secretions in the piriform sinuses. The true vocal folds were mobile bilaterally. There were no signs of glottic or supraglottic mucosal lesion or mass. There was moderate interarytenoid pachydermia and post cricoid edema. The telescope was then slowly withdrawn and the patient tolerated the procedure throughout.    Studies Reviewed: CT chest 12/01/23 IMPRESSION: 1. There are 2 left upper lobe pulmonary nodules, the largest measuring 6 mm. Non-contrast chest CT at 3-6 months is recommended. If the nodules are stable at time of repeat CT, then future CT at 18-24 months (from today's scan) is considered optional for low-risk patients, but is recommended for high-risk patients. This recommendation follows the consensus statement: Guidelines for Management of Incidental Pulmonary Nodules Detected on CT Images:From the Fleischner Society 2017; published online before print (10.1148/radiol.[id number]). 2. Scattered ground-glass centrilobular nodularity within upper lung zone predominance. Findings are favored to represent smoking-related respiratory bronchiolitis. 3. Coronary artery calcifications. 4. Aortic  Atherosclerosis (ICD10-I70.0) and Emphysema (ICD10-J43.9).  Assessment/Plan: Encounter Diagnoses  Name Primary?   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 Surgical removal contingent on smoking cessation to prevent recurrence. - Advised smoking cessation as prerequisite for surgery. - will consider removal if he stops smoking   Tobacco use disorder  We had an extensive discussion about detrimental effects of smoking on overall health. I provided resources available at [facility] to assist with smoking cessation. I spent 4 min on counseling  - Advised smoking cessation  Tongue lesion left anterior 2/3 along lateral portion of the tongue Tongue lesion possibly due to irritation from sharp tooth vs neoplastic changes vs pre-cancerous chances. Differential includes irritation versus precancerous or cancerous lesion. Biopsy needed to rule out malignancy. - Schedule biopsy of tongue lesion under local anesthesia in office.     RTC for tongue biopsy     Thank you for allowing me to participate in the care of this patient. Please do not hesitate to contact me with any questions or concerns.   Artice Last, MD  Otolaryngology [facility] Phone: [phone] Fax: [phone]    02/19/2024, 11:04 AM

## 2024-02-19 NOTE — Patient Instructions (Signed)
 Dear Evan Bennett,   Congratulations for your interest in quitting smoking!  Find a program that suits you best: when you want to quit, how you need support, where you live, and how you like to learn.    If you're ready to get started TODAY, consider scheduling a visit through [email or web address].  Appointments are available from 8am to 8pm, Monday to Friday.   Most health insurance plans will cover some level of tobacco cessation visits and medications.    Additional Resources: [employer] are also available to help you quit & provide the support you'll need. Many programs are available in both [place] and Spanish and have a long history of successfully helping people get off and stay off tobacco.    Quit Smoking Apps:  quitSTART at [employer].de QuitGuide?at ForgetParking.dk Online education and resources: Smokefree  at [employer].gov Free Telephone Coaching: QuitNow,  Call 1-800-QUIT-NOW (450-408-3655) or Text- Ready to 9371778564 *Quitline [place] has teamed up with Medicaid to offer a free 14 week program    Vaping- Want to Quit? Free 24/7 support. Call [facility]  [place], [place], [place], [place], [place]  [facility]

## 2024-03-18 ENCOUNTER — Ambulatory Visit (INDEPENDENT_AMBULATORY_CARE_PROVIDER_SITE_OTHER): Payer: MEDICAID | Admitting: Otolaryngology

## 2024-03-18 NOTE — Progress Notes (Deleted)
 Procedure: Excision of oral tongue lesion without closure (58889)  ATTENDING: Elena Larry, M.D.   PREOPERATIVE DIAGNOSIS(ES): left anterior tongue leukoplakia/lesion  POSTOPERATIVE DIAGNOSIS(ES): Same  PROCEDURE PERFORMED: biopsy of the left lateral tongue lesion   INDICATIONS FOR PROCEDURE: oral tongue lesion  The risks and benefits of the surgical procedure have been explained in detail to the patient and they have elected to proceed.  CONSENT:  Informed consent was obtained prior to the procedure after discussion of risks, benefits, and alternatives and expected outcomes were discussed with the patient; consent placed in chart. The possibilities of reaction to medication, bleeding, infection, inadequate biopsy, the need for additional procedures, failure to diagnose a condition, and creating a complication requiring transfusion or operation were discussed with the patient. The patient concurred with the proposed plan, giving informed consent.    UNIVERSAL PROTOCOL/ TIMEOUT: Preprocedure verification is complete- patient verified and consents confirmed.  H&P REVIEW: The patient's history and physical were reviewed today prior to procedure. All medications were reviewed and updated as well.  ANESTHESIA: local anesthesia with 1% lidocaine, 1:100,000 epinephrine  PROCEDURE DETAILS: The patient was brought to the clinic and placed in a seated position.  The mucosa over the lesion was anesthetized.  A 0.5 cm incision was made over the lesion and the lesion was dissected from surrounding structures and removed. The incision was made at the border of the lesion.  It was sent for permanent pathology. Hemostasis was obtained using Afrin soaked pledgets and Silver Nitrate. There were no complications and the patient tolerated the procedure well.  ESTIMATED BLOOD LOSS: Minimal  DISPOSITION OF SPECIMEN(S): Permanent pathology  FINDINGS:  No evidence of significant bleeding or other  complication  CONDITION: Stable  COMPLICATIONS:The patient tolerated the procedure well without apparent complications and was ambulatory.  NOTES: given rx for Peridex mouthwash

## 2024-04-08 ENCOUNTER — Other Ambulatory Visit: Payer: Self-pay | Admitting: Adult Health

## 2024-04-13 ENCOUNTER — Emergency Department (HOSPITAL_COMMUNITY)
Admission: EM | Admit: 2024-04-13 | Discharge: 2024-04-13 | Disposition: A | Discharge status: Home / Self Care | Payer: MEDICAID

## 2024-04-13 ENCOUNTER — Other Ambulatory Visit: Payer: Self-pay

## 2024-04-13 ENCOUNTER — Emergency Department (HOSPITAL_COMMUNITY): Payer: MEDICAID

## 2024-04-13 DIAGNOSIS — W101XXA Fall (on)(from) sidewalk curb, initial encounter: Secondary | ICD-10-CM | POA: Diagnosis not present

## 2024-04-13 DIAGNOSIS — Y92480 Sidewalk as the place of occurrence of the external cause: Secondary | ICD-10-CM | POA: Diagnosis not present

## 2024-04-13 DIAGNOSIS — S42022A Displaced fracture of shaft of left clavicle, initial encounter for closed fracture: Secondary | ICD-10-CM | POA: Diagnosis not present

## 2024-04-13 DIAGNOSIS — I10 Essential (primary) hypertension: Secondary | ICD-10-CM | POA: Diagnosis not present

## 2024-04-13 DIAGNOSIS — M25512 Pain in left shoulder: Secondary | ICD-10-CM | POA: Diagnosis present

## 2024-04-13 MED ORDER — OXYCODONE HCL 5 MG PO TABS: 5.0000 mg | ORAL_TABLET | ORAL | 0 refills | Status: DC | PRN

## 2024-04-13 MED ORDER — ONDANSETRON 4 MG PO TBDP
4.0000 mg | ORAL_TABLET | Freq: Once | ORAL | Status: AC
  Administered 2024-04-13: 4 mg via ORAL
  Filled 2024-04-13: qty 1

## 2024-04-13 MED ORDER — HYDROCODONE-ACETAMINOPHEN 5-325 MG PO TABS
1.0000 | ORAL_TABLET | Freq: Once | ORAL | Status: AC
  Administered 2024-04-13: 1 via ORAL
  Filled 2024-04-13: qty 1

## 2024-04-13 MED ORDER — OXYCODONE HCL 5 MG PO TABS: 5.0000 mg | ORAL_TABLET | ORAL | 0 refills | Status: AC | PRN

## 2024-04-13 NOTE — Discharge Instructions (Addendum)
 You have a minimally displaced clavicular fracture.  Shoulder sling and immobilizer placed.  Orthopedic referral given.  Pain medicine sent into your pharmacy.  Please schedule an appointment with the orthopedist.  Primarily take ibuprofen 600 mg every 6 hours.  This will help with the inflammation as well.  Ice to the area.  You can also take 1000 mg of Tylenol  every 6 hours.  Reserve the pain medicine for severe breakthrough pain.  Do not drive or do anything else dangerous after taking the medicine.

## 2024-04-13 NOTE — ED Triage Notes (Signed)
 Pt BIB EMS with CC of left shoulder pain doe to injury sustained s/p GLF on grass. Pt states he was walking on a concrete sidewalk when he tripped on a concrete seam that was unlevel. Pt reported directing his fall to a grassy area rather than the concrete. Endorses 6/10 pain left should when immobile but higher when moving.

## 2024-04-13 NOTE — ED Provider Notes (Signed)
  EMERGENCY DEPARTMENT AT [facility] Provider Note   CSN: [id number] Arrival date & time: 04/13/24  1133     Patient presents with: Shoulder Injury   Evan Bennett is a 58 y.o. male.   58 year old male presents today for concern of left shoulder pain.  He states he was walking on sidewalk when he stepped on an uneven portion causing him to trip and fall.  He states he fell onto the grassy area.  Has some swelling over his clavicle.  Denies any head injury, neck pain, or other concerns.  The history is provided by the patient. No language interpreter was used.       Prior to Admission medications   Medication Sig Start Date End Date Taking? Authorizing Provider  albuterol  (VENTOLIN  HFA) 108 (90 Base) MCG/ACT inhaler INHALE 1 TO 2 PUFFS INTO THE LUNGS EVERY 6 HOURS AS NEEDED FOR WHEEZING OR SHORTNESS OF BREATH 04/09/24   Parrett, Madelin RAMAN, NP    Allergies: Patient has no known allergies.    Review of Systems  Constitutional:  Negative for fever.  Musculoskeletal:  Positive for arthralgias and joint swelling.  All other systems reviewed and are negative.   Updated Vital Signs BP (!) 193/110 (BP Location: Right Arm)   Pulse 70   Temp 98.1 F (36.7 C) (Oral)   Resp 15   Ht 5' 10 (1.778 m)   Wt 72 kg   SpO2 100%   BMI 22.78 kg/m   Physical Exam Vitals and nursing note reviewed.  Constitutional:      General: He is not in acute distress.    Appearance: Normal appearance. He is not ill-appearing.  HENT:     Head: Normocephalic and atraumatic.     Nose: Nose normal.  Eyes:     Conjunctiva/sclera: Conjunctivae normal.  Cardiovascular:     Rate and Rhythm: Normal rate and regular rhythm.  Pulmonary:     Effort: Pulmonary effort is normal. No respiratory distress.  Musculoskeletal:     Comments: Range of motion in the left shoulder.  Left shoulder joint without swelling or deformity.  There is swelling over the clavicle on the left with tenderness.   No skin tenting.  Neurovascularly intact in the left upper extremity.  Skin:    Findings: No rash.  Neurological:     Mental Status: He is alert.     (all labs ordered are listed, but only abnormal results are displayed) Labs Reviewed - No data to display  EKG: None  Radiology: No results found.   Procedures   Medications Ordered in the ED  ondansetron  (ZOFRAN -ODT) disintegrating tablet 4 mg (4 mg Oral Given 04/13/24 1301)  HYDROcodone -acetaminophen  (NORCO/VICODIN) 5-325 MG per tablet 1 tablet (1 tablet Oral Given 04/13/24 1301)                                    Medical Decision Making Amount and/or Complexity of Data Reviewed Radiology: ordered.  Risk Prescription drug management.   Medical Decision Making / ED Course   This patient presents to the ED for concern of fall, this involves an extensive number of treatment options, and is a complaint that carries with it a high risk of complications and morbidity.  The differential diagnosis includes fracture, dislocation, contusion  MDM: 58 year old male presents today after fall.  He has some swelling over his clavicle.  X-ray obtained.  X-ray with  minimally displaced mid clavicular fracture on the left.  Neurovascularly intact.  No skin tenting.  Orthopedic referral given. Pain control prescribed. Discharged in stable condition.  Return precaution discussed.  Lab Tests: -I ordered, reviewed, and interpreted labs.   The pertinent results include:   Labs Reviewed - No data to display    EKG  EKG Interpretation Date/Time:    Ventricular Rate:    PR Interval:    QRS Duration:    QT Interval:    QTC Calculation:   R Axis:      Text Interpretation:           Imaging Studies ordered: I ordered imaging studies including left clavicular x-ray I independently visualized and interpreted imaging. I agree with the radiologist interpretation   Medicines ordered and prescription drug management: Meds  ordered this encounter  Medications   ondansetron  (ZOFRAN -ODT) disintegrating tablet 4 mg   HYDROcodone -acetaminophen  (NORCO/VICODIN) 5-325 MG per tablet 1 tablet    Refill:  0    -I have reviewed the patients home medicines and have made adjustments as needed  Reevaluation: After the interventions noted above, I reevaluated the patient and found that they have :improved  Co morbidities that complicate the patient evaluation  Past Medical History:  Diagnosis Date   Depression    Hypertension       Dispostion: Discharged in stable condition.  Return precaution discussed.  Patient voices understanding and is in agreement with plan.  Final diagnoses:  Closed displaced fracture of shaft of left clavicle, initial encounter    ED Discharge Orders          Ordered    oxyCODONE  (ROXICODONE ) 5 MG immediate release tablet  Every 4 hours PRN        04/13/24 1438               Hildegard Loge, PA-C 04/13/24 1441    Ula Prentice SAUNDERS, MD 04/13/24 1521

## 2024-04-13 NOTE — Progress Notes (Signed)
 Orthopedic Tech Progress Note Patient Details:  Evan Bennett 18-May-1966 [id number]  Ortho Devices Type of Ortho Device: Shoulder immobilizer Ortho Device/Splint Location: LUE Ortho Device/Splint Interventions: Ordered, Application, Adjustment   Post Interventions Patient Tolerated: Well Instructions Provided: Care of device  [name] Pac 04/13/2024, 3:17 PM

## 2024-04-13 NOTE — ED Notes (Signed)
 Oxycodone  rx cancelled with [facility] at [facility] pharmacy per provider request

## 2024-04-23 ENCOUNTER — Other Ambulatory Visit: Payer: Self-pay | Admitting: Adult Health

## 2024-04-24 ENCOUNTER — Ambulatory Visit (HOSPITAL_BASED_OUTPATIENT_CLINIC_OR_DEPARTMENT_OTHER): Payer: MEDICAID

## 2024-04-28 ENCOUNTER — Ambulatory Visit (HOSPITAL_BASED_OUTPATIENT_CLINIC_OR_DEPARTMENT_OTHER)
Admission: RE | Admit: 2024-04-28 | Discharge: 2024-04-28 | Disposition: A | Discharge status: Home / Self Care | Payer: MEDICAID | Source: Ambulatory Visit | Attending: Adult Health | Admitting: Adult Health

## 2024-04-28 DIAGNOSIS — R911 Solitary pulmonary nodule: Secondary | ICD-10-CM | POA: Insufficient documentation

## 2024-05-13 ENCOUNTER — Ambulatory Visit: Payer: Self-pay | Admitting: Adult Health

## 2024-05-13 DIAGNOSIS — J439 Emphysema, unspecified: Secondary | ICD-10-CM

## 2024-05-13 DIAGNOSIS — Z720 Tobacco use: Secondary | ICD-10-CM
